# Patient Record
Sex: Male | Born: 1990 | Race: White | Hispanic: No | Marital: Single | Smoking: Current some day smoker
Health system: Southern US, Community
[De-identification: ages and names within clinical notes are randomized; demographics above are authoritative.]

---
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
## 2008-01-18 ENCOUNTER — Emergency Department (HOSPITAL_COMMUNITY): Admission: EM | Admit: 2008-01-18 | Discharge: 2008-01-18 | Payer: Self-pay | Admitting: Emergency Medicine

## 2008-02-25 ENCOUNTER — Emergency Department: Payer: Self-pay | Admitting: Emergency Medicine

## 2008-08-26 ENCOUNTER — Emergency Department: Payer: Self-pay | Admitting: Unknown Physician Specialty

## 2008-11-23 ENCOUNTER — Emergency Department: Payer: Self-pay | Admitting: Emergency Medicine

## 2008-11-25 ENCOUNTER — Emergency Department: Payer: Self-pay | Admitting: Emergency Medicine

## 2009-05-08 ENCOUNTER — Emergency Department: Payer: Self-pay | Admitting: Emergency Medicine

## 2009-06-25 ENCOUNTER — Emergency Department: Payer: Self-pay | Admitting: Emergency Medicine

## 2009-12-14 ENCOUNTER — Emergency Department: Payer: Self-pay | Admitting: Internal Medicine

## 2011-08-10 LAB — URINALYSIS, ROUTINE W REFLEX MICROSCOPIC
Glucose, UA: NEGATIVE
Protein, ur: NEGATIVE
pH: 5.5

## 2011-11-15 ENCOUNTER — Emergency Department: Payer: Self-pay | Admitting: Emergency Medicine

## 2018-05-20 ENCOUNTER — Encounter (HOSPITAL_COMMUNITY): Payer: Self-pay | Admitting: Emergency Medicine

## 2018-05-20 ENCOUNTER — Other Ambulatory Visit: Payer: Self-pay

## 2018-05-20 ENCOUNTER — Emergency Department (HOSPITAL_COMMUNITY)
Admission: EM | Admit: 2018-05-20 | Discharge: 2018-05-20 | Disposition: A | Discharge status: Home / Self Care | Payer: Self-pay | Attending: Emergency Medicine | Admitting: Emergency Medicine

## 2018-05-20 DIAGNOSIS — R109 Unspecified abdominal pain: Secondary | ICD-10-CM | POA: Insufficient documentation

## 2018-05-20 DIAGNOSIS — Z5321 Procedure and treatment not carried out due to patient leaving prior to being seen by health care provider: Secondary | ICD-10-CM | POA: Insufficient documentation

## 2018-05-20 NOTE — ED Notes (Signed)
Pt called for a room 3 times with no responce

## 2018-05-20 NOTE — ED Triage Notes (Signed)
Pt complaint of lest testicle pain post lifting a tire 5 days ago; denies swelling.

## 2018-05-20 NOTE — ED Notes (Signed)
Called to room x 2

## 2018-05-23 NOTE — ED Notes (Signed)
Follow up call made  No answer  05/23/18  0928  s Aleni Andrus rn

## 2018-05-28 ENCOUNTER — Emergency Department: Payer: Self-pay

## 2018-05-28 ENCOUNTER — Emergency Department
Admission: EM | Admit: 2018-05-28 | Discharge: 2018-05-28 | Disposition: A | Discharge status: Home / Self Care | Payer: Self-pay | Attending: Emergency Medicine | Admitting: Emergency Medicine

## 2018-05-28 ENCOUNTER — Other Ambulatory Visit: Payer: Self-pay

## 2018-05-28 DIAGNOSIS — N50819 Testicular pain, unspecified: Secondary | ICD-10-CM

## 2018-05-28 DIAGNOSIS — I861 Scrotal varices: Secondary | ICD-10-CM | POA: Insufficient documentation

## 2018-05-28 MED ORDER — NAPROXEN 500 MG PO TABS: 500.0000 mg | ORAL_TABLET | Freq: Two times a day (BID) | ORAL | 0 refills | Status: DC

## 2018-05-28 NOTE — ED Triage Notes (Signed)
Pt states L testicular pain x 1 week. Denies injury. Denies swelling. Alert, oriented, ambulatory.

## 2018-05-28 NOTE — ED Provider Notes (Signed)
[facility] Emergency Department Provider Note  ____________________________________________   First MD Initiated Contact with Patient 05/28/18 [phone]     (approximate)  I have reviewed the triage vital signs and the nursing notes.   HISTORY  Chief Complaint Testicle Pain  HPI Douglas [name] is a 27 y.o. male is here with complaint of left testicular pain.  Patient states pain started approximately 1 week ago when he was lifting a heavy tire.  He denies any difficulties prior to this.  He denies any abdominal pain.  He states that the pain is in his scrotum and not in the groin area.  He denies any fever or chills.  There is been no urinary symptoms or penile discharge.  Rates pain as 4/10.  History reviewed. No pertinent past medical history.  There are no active problems to display for this patient.   History reviewed. No pertinent surgical history.  Prior to Admission medications   Medication Sig Start Date End Date Taking? Authorizing Provider  naproxen (NAPROSYN) 500 MG tablet Take 1 tablet (500 mg total) by mouth 2 (two) times daily with a meal. 05/28/18   Tommi Rumps, PA-C    Allergies Patient has no known allergies.  History reviewed. No pertinent family history.  Social History Social History   Tobacco Use  . Smoking status: Never Smoker  Substance Use Topics  . Alcohol use: Yes  . Drug use: Not on file    Review of Systems Constitutional: No fever/chills Cardiovascular: Denies chest pain. Respiratory: Denies shortness of breath. Genitourinary: Positive for left scrotal pain. Musculoskeletal: Negative for back pain. Skin: Negative for rash. Neurological: Negative for headaches, focal weakness or numbness. ___________________________________________   PHYSICAL EXAM:  VITAL SIGNS: ED Triage Vitals  Enc Vitals Group     BP 05/28/18 0821 120/88     Pulse Rate 05/28/18 0821 70     Resp 05/28/18 0821 15     Temp 05/28/18  0821 98.6 F (37 C)     Temp Source 05/28/18 0821 Oral     SpO2 05/28/18 0821 97 %     Weight 05/28/18 0824 170 lb (77.1 kg)     Height 05/28/18 0824 5\' 9"  (1.753 m)     Head Circumference --      Peak Flow --      Pain Score 05/28/18 0824 4     Pain Loc --      Pain Edu? --      Excl. in GC? --    Constitutional: Alert and oriented. Well appearing and in no acute distress. Eyes: Conjunctivae are normal.  Head: Atraumatic. Neck: No stridor.   Cardiovascular: Normal rate, regular rhythm. Grossly normal heart sounds.  Good peripheral circulation. Respiratory: Normal respiratory effort.  No retractions. Lungs CTAB. Gastrointestinal: Soft and nontender. No distention. Genitourinary: On examination there is no gross deformity noted.  There is no inguinal tenderness.  There is tenderness on palpation of the left scrotum distally.  No erythema, abrasions or soft tissue edema present. Musculoskeletal: Moves upper and lower extremities without any difficulty normal gait was noted. Neurologic:  Normal speech and language. No gross focal neurologic deficits are appreciated.  Skin:  Skin is warm, dry and intact.  Psychiatric: Mood and affect are normal. Speech and behavior are normal.  ____________________________________________   LABS (all labs ordered are listed, but only abnormal results are displayed)  Labs Reviewed - No data to display  RADIOLOGY   Official radiology report(s): US  Scrotum  Result Date: 05/28/2018 CLINICAL DATA:  27 year old male with left-sided testicular pain for a week EXAM: SCROTAL ULTRASOUND DOPPLER ULTRASOUND OF THE TESTICLES TECHNIQUE: Complete ultrasound examination of the testicles, epididymis, and other scrotal structures was performed. Color and spectral Doppler ultrasound were also utilized to evaluate blood flow to the testicles. COMPARISON:  None. FINDINGS: Right testicle Measurements: 3.2 x 1.9 x 2.8 cm. No mass or microlithiasis visualized. Left  testicle Measurements: 3.9 x 2.3 x 2.6 cm. No mass or microlithiasis visualized. Right epididymis:  Normal in size and appearance. Left epididymis:  Normal in size and appearance. Hydrocele:  None visualized. Varicocele:  Positive for a small left-sided varicocele. Pulsed Doppler interrogation of both testes demonstrates normal low resistance arterial and venous waveforms bilaterally. IMPRESSION: 1. No evidence of testicular torsion at this time. 2. No evidence of orchitis, epididymitis or other acute abnormality. 3. Positive for a small left-sided varicocele which could represent a source of left testicular discomfort. Electronically Signed   By: Malachy MoanHeath  McCullough M.D.   On: 05/28/2018 09:35   [place] Scrotum Doppler  Result Date: 05/28/2018 CLINICAL DATA:  27 year old male with left-sided testicular pain for a week EXAM: SCROTAL ULTRASOUND DOPPLER ULTRASOUND OF THE TESTICLES TECHNIQUE: Complete ultrasound examination of the testicles, epididymis, and other scrotal structures was performed. Color and spectral Doppler ultrasound were also utilized to evaluate blood flow to the testicles. COMPARISON:  None. FINDINGS: Right testicle Measurements: 3.2 x 1.9 x 2.8 cm. No mass or microlithiasis visualized. Left testicle Measurements: 3.9 x 2.3 x 2.6 cm. No mass or microlithiasis visualized. Right epididymis:  Normal in size and appearance. Left epididymis:  Normal in size and appearance. Hydrocele:  None visualized. Varicocele:  Positive for a small left-sided varicocele. Pulsed Doppler interrogation of both testes demonstrates normal low resistance arterial and venous waveforms bilaterally. IMPRESSION: 1. No evidence of testicular torsion at this time. 2. No evidence of orchitis, epididymitis or other acute abnormality. 3. Positive for a small left-sided varicocele which could represent a source of left testicular discomfort. Electronically Signed   By: Malachy MoanHeath  McCullough M.D.   On: 05/28/2018 09:35     ____________________________________________   PROCEDURES  Procedure(s) performed: None  Procedures  Critical Care performed: No  ____________________________________________   INITIAL IMPRESSION / ASSESSMENT AND PLAN / ED COURSE  As part of my medical decision making, I reviewed the following data within the electronic MEDICAL RECORD NUMBER Notes from prior ED visits and [place] Controlled Substance Database  Patient was reassured that he did not have a testicular torsion.  We discussed his ultrasound findings and he was given information about varicoceles.  Patient will take naproxen 500 mg twice daily for the next 10 days.  If there is no improvement he is to follow-up with the urologist listed on his discharge papers who is on-call this weekend.  If any severe worsening of his symptoms he is to return to the emergency department this weekend.  ____________________________________________   FINAL CLINICAL IMPRESSION(S) / ED DIAGNOSES  Final diagnoses:  Left varicocele     ED Discharge Orders        Ordered    naproxen (NAPROSYN) 500 MG tablet  2 times daily with meals     05/28/18 0955       Note:  This document was prepared using Dragon voice recognition software and may include unintentional dictation errors.    Tommi RumpsSummers, Noemy Hallmon L, PA-C 05/28/18 1524    Minna AntisPaduchowski, Kevin, MD 05/29/18 1245

## 2018-05-28 NOTE — Discharge Instructions (Addendum)
Begin taking naproxen 500 mg twice daily with food.  If not improving you will need to see a urologist.  You may call 1 of the offices listed in [place] however the urologist on call today has an office in [place].  His contact information is listed on your discharge papers.

## 2018-05-28 NOTE — ED Notes (Signed)
See triage note   States he developed pain to left testicle after lifting heavy tire at work  Denies any n/v/d ,urinary sx's or discharge  Also denies any swelling to groin or testicle

## 2020-06-15 ENCOUNTER — Other Ambulatory Visit: Payer: Self-pay

## 2020-06-15 ENCOUNTER — Emergency Department
Admission: EM | Admit: 2020-06-15 | Discharge: 2020-06-17 | Disposition: A | Discharge status: Home / Self Care | Payer: Self-pay | Attending: Emergency Medicine | Admitting: Emergency Medicine

## 2020-06-15 ENCOUNTER — Emergency Department: Payer: Self-pay

## 2020-06-15 DIAGNOSIS — R42 Dizziness and giddiness: Secondary | ICD-10-CM | POA: Insufficient documentation

## 2020-06-15 DIAGNOSIS — Z5321 Procedure and treatment not carried out due to patient leaving prior to being seen by health care provider: Secondary | ICD-10-CM | POA: Insufficient documentation

## 2020-06-15 DIAGNOSIS — R0789 Other chest pain: Secondary | ICD-10-CM | POA: Insufficient documentation

## 2020-06-15 LAB — BASIC METABOLIC PANEL
Anion gap: 11 (ref 5–15)
BUN: 12 mg/dL (ref 6–20)
CO2: 26 mmol/L (ref 22–32)
Calcium: 9.3 mg/dL (ref 8.9–10.3)
Chloride: 100 mmol/L (ref 98–111)
Creatinine, Ser: 0.88 mg/dL (ref 0.61–1.24)
GFR calc Af Amer: >60 mL/min (ref >60)
GFR calc non Af Amer: >60 mL/min (ref >60)
Glucose, Bld: 130 mg/dL — ABNORMAL HIGH (ref 70–99)
Potassium: 3.6 mmol/L (ref 3.5–5.1)
Sodium: 137 mmol/L (ref 135–145)

## 2020-06-15 LAB — CBC
HCT: 45.5 % (ref 39.0–52.0)
Hemoglobin: 16.6 g/dL (ref 13.0–17.0)
MCH: 32.9 pg (ref 26.0–34.0)
MCHC: 36.5 g/dL — ABNORMAL HIGH (ref 30.0–36.0)
MCV: 90.3 fL (ref 80.0–100.0)
Platelets: 236 10*3/uL (ref 150–400)
RBC: 5.04 MIL/uL (ref 4.22–5.81)
RDW: 12.1 % (ref 11.5–15.5)
WBC: 5 10*3/uL (ref 4.0–10.5)
nRBC: 0 % (ref 0.0–0.2)

## 2020-06-15 LAB — TROPONIN I (HIGH SENSITIVITY)
Troponin I (High Sensitivity): 3 ng/L (ref <18)
Troponin I (High Sensitivity): 3 ng/L (ref <18)

## 2020-06-15 MED ORDER — SODIUM CHLORIDE 0.9% FLUSH: 3.0000 mL | Freq: Once | INTRAVENOUS | Status: DC

## 2020-06-15 NOTE — ED Triage Notes (Signed)
Pt arrives POV for c/o left side of chest "feeling funny" x several weeks. PT reports he does have some pain in the area but states it just "doesn't feel right". Pt reports feeling like he is going to "give out" and reports dizziness and lightheadedness as well. Pt ambulatory from triage in NAD, skin warm and dry, no shob.

## 2020-09-05 ENCOUNTER — Encounter: Payer: Self-pay | Admitting: Emergency Medicine

## 2020-09-05 ENCOUNTER — Other Ambulatory Visit: Payer: Self-pay

## 2020-09-05 ENCOUNTER — Emergency Department
Admission: EM | Admit: 2020-09-05 | Discharge: 2020-09-05 | Disposition: A | Discharge status: Home / Self Care | Payer: Self-pay | Attending: Emergency Medicine | Admitting: Emergency Medicine

## 2020-09-05 ENCOUNTER — Emergency Department: Payer: Self-pay

## 2020-09-05 DIAGNOSIS — R072 Precordial pain: Secondary | ICD-10-CM | POA: Insufficient documentation

## 2020-09-05 DIAGNOSIS — R079 Chest pain, unspecified: Secondary | ICD-10-CM

## 2020-09-05 DIAGNOSIS — F17200 Nicotine dependence, unspecified, uncomplicated: Secondary | ICD-10-CM | POA: Insufficient documentation

## 2020-09-05 LAB — TROPONIN I (HIGH SENSITIVITY)
Troponin I (High Sensitivity): 5 ng/L (ref <18)
Troponin I (High Sensitivity): 4 ng/L (ref <18)

## 2020-09-05 LAB — CBC
HCT: 40.6 % (ref 39.0–52.0)
Hemoglobin: 14.8 g/dL (ref 13.0–17.0)
MCH: 33 pg (ref 26.0–34.0)
MCHC: 36.5 g/dL — ABNORMAL HIGH (ref 30.0–36.0)
MCV: 90.6 fL (ref 80.0–100.0)
Platelets: 232 10*3/uL (ref 150–400)
RBC: 4.48 MIL/uL (ref 4.22–5.81)
RDW: 11.9 % (ref 11.5–15.5)
WBC: 8.8 10*3/uL (ref 4.0–10.5)
nRBC: 0 % (ref 0.0–0.2)

## 2020-09-05 LAB — BASIC METABOLIC PANEL
Anion gap: 13 (ref 5–15)
BUN: 8 mg/dL (ref 6–20)
CO2: 23 mmol/L (ref 22–32)
Calcium: 8.7 mg/dL — ABNORMAL LOW (ref 8.9–10.3)
Chloride: 99 mmol/L (ref 98–111)
Creatinine, Ser: 0.65 mg/dL (ref 0.61–1.24)
GFR, Estimated: >60 mL/min (ref >60)
Glucose, Bld: 109 mg/dL — ABNORMAL HIGH (ref 70–99)
Potassium: 3.4 mmol/L — ABNORMAL LOW (ref 3.5–5.1)
Sodium: 135 mmol/L (ref 135–145)

## 2020-09-05 MED ORDER — LIDOCAINE VISCOUS HCL 2 % MT SOLN
15.0000 mL | Freq: Once | OROMUCOSAL | Status: AC
  Administered 2020-09-05: 15 mL via ORAL
  Filled 2020-09-05: qty 15

## 2020-09-05 MED ORDER — SUCRALFATE 1 GM/10ML PO SUSP: 1.0000 g | Freq: Four times a day (QID) | ORAL | 1 refills | Status: AC

## 2020-09-05 MED ORDER — SUCRALFATE 1 GM/10ML PO SUSP: 1.0000 g | Freq: Four times a day (QID) | ORAL | 1 refills | Status: DC

## 2020-09-05 MED ORDER — ALUM & MAG HYDROXIDE-SIMETH 200-200-20 MG/5ML PO SUSP
30.0000 mL | Freq: Once | ORAL | Status: AC
  Administered 2020-09-05: 30 mL via ORAL
  Filled 2020-09-05: qty 30

## 2020-09-05 MED ORDER — FAMOTIDINE 20 MG PO TABS: 20.0000 mg | ORAL_TABLET | Freq: Two times a day (BID) | ORAL | 1 refills | Status: AC

## 2020-09-05 MED ORDER — FAMOTIDINE 20 MG PO TABS: 20.0000 mg | ORAL_TABLET | Freq: Two times a day (BID) | ORAL | 1 refills | Status: DC

## 2020-09-05 NOTE — ED Triage Notes (Signed)
Pt comes into the ED via POV c/o central chest pain that increases when taking a deep breath.  Pt has no diaphoresis, no cardiac history, no SHOB, N/V.  Pt in NAD with even and unlabored respirations.

## 2020-09-05 NOTE — ED Provider Notes (Signed)
[name] Provider Note   ____________________________________________   First MD Initiated Contact with Patient 09/05/20 1717     (approximate)  I have reviewed the triage vital signs and the nursing notes.   HISTORY  Chief Complaint Chest Pain    HPI Douglas [name] is a 29 y.o. male with no stated past medical history the presents for substernal chest aching that began on awakening this morning.  Patient states that this pain has been steady since onset and is worsened with deep breathing.  Patient describes this pain as 7/10 and nonradiating.  Patient does endorse worsening with exertion.  Denies any history of cardiac disease or family history of early cardiac disease.  Patient denies any relieving factors.  Patient has tried naproxen for this pain with minimal relief      No past medical history on file.  There are no problems to display for this patient.   No past surgical history on file.  Prior to Admission medications   Medication Sig Start Date End Date Taking? Authorizing Provider  famotidine (PEPCID) 20 MG tablet Take 1 tablet (20 mg total) by mouth 2 (two) times daily. 09/05/20 09/05/21  Merwyn Katos, MD  naproxen (NAPROSYN) 500 MG tablet Take 1 tablet (500 mg total) by mouth 2 (two) times daily with a meal. 05/28/18   Bridget Hartshorn L, PA-C  sucralfate (CARAFATE) 1 GM/10ML suspension Take 10 mLs (1 g total) by mouth 4 (four) times daily. 09/05/20 09/05/21  Merwyn Katos, MD    Allergies Patient has no known allergies.  No family history on file.  Social History Social History   Tobacco Use  . Smoking status: Current Some Day Smoker  . Smokeless tobacco: Never Used  Substance Use Topics  . Alcohol use: Yes  . Drug use: Not Currently    Review of Systems Constitutional: No fever/chills Eyes: No visual changes. ENT: No sore throat. Cardiovascular: Endorses chest pain. Respiratory: Endorses  shortness of breath. Gastrointestinal: No abdominal pain.  No nausea, no vomiting.  No diarrhea. Genitourinary: Negative for dysuria. Musculoskeletal: Negative for acute arthralgias Skin: Negative for rash. Neurological: Negative for headaches, weakness/numbness/paresthesias in any extremity Psychiatric: Negative for suicidal ideation/homicidal ideation   ____________________________________________   PHYSICAL EXAM:  VITAL SIGNS: ED Triage Vitals  Enc Vitals Group     BP 09/05/20 1608 120/67     Pulse Rate 09/05/20 1608 95     Resp 09/05/20 1608 20     Temp 09/05/20 1608 100.1 F (37.8 C)     Temp Source 09/05/20 1608 Oral     SpO2 09/05/20 1608 99 %     Weight 09/05/20 1606 170 lb (77.1 kg)     Height 09/05/20 1606 5\' 11"  (1.803 m)     Head Circumference --      Peak Flow --      Pain Score 09/05/20 1606 9     Pain Loc --      Pain Edu? --      Excl. in GC? --    Constitutional: Alert and oriented. Well appearing and in no acute distress. Eyes: Conjunctivae are normal. PERRL. Head: Atraumatic. Nose: No congestion/rhinnorhea. Mouth/Throat: Mucous membranes are moist. Neck: No stridor Cardiovascular: Grossly normal heart sounds.  Good peripheral circulation. Respiratory: Normal respiratory effort.  No retractions. Gastrointestinal: Soft and nontender. No distention. Musculoskeletal: No obvious deformities Neurologic:  Normal speech and language. No gross focal neurologic deficits are appreciated. Skin:  Skin  is warm and dry. No rash noted. Psychiatric: Mood and affect are normal. Speech and behavior are normal.  ____________________________________________   LABS (all labs ordered are listed, but only abnormal results are displayed)  Labs Reviewed  BASIC METABOLIC PANEL - Abnormal; Notable for the following components:      Result Value   Potassium 3.4 (*)    Glucose, Bld 109 (*)    Calcium 8.7 (*)    All other components within normal limits  CBC - Abnormal;  Notable for the following components:   MCHC 36.5 (*)    All other components within normal limits  TROPONIN I (HIGH SENSITIVITY)  TROPONIN I (HIGH SENSITIVITY)   ____________________________________________  EKG  ED ECG REPORT I, Merwyn Katos, the attending physician, personally viewed and interpreted this ECG.  Date: 09/05/2020 EKG Time: 1809 Rate: 86 Rhythm: normal sinus rhythm QRS Axis: normal Intervals: incomplete RBBB ST/T Wave abnormalities: normal Narrative Interpretation: no evidence of acute ischemia  ____________________________________________  RADIOLOGY  ED MD interpretation: 2 view x-ray of the chest shows no evidence of acute abnormalities including no pneumothorax, pneumonia, or widened mediastinum  Official radiology report(s): DG Chest 2 View  Result Date: 09/05/2020 CLINICAL DATA:  Chest pain which increases when taking a deep breath EXAM: CHEST - 2 VIEW COMPARISON:  06/15/2020 FINDINGS: Normal heart size, mediastinal contours, and pulmonary vascularity. Lungs clear. No pleural effusion or pneumothorax. Bones unremarkable. IMPRESSION: Normal exam. Electronically Signed   By: Ulyses Southward M.D.   On: 09/05/2020 16:38    ____________________________________________   PROCEDURES  Procedure(s) performed (including Critical Care):  .1-3 Lead EKG Interpretation Performed by: Merwyn Katos, MD Authorized by: Merwyn Katos, MD     Interpretation: normal     ECG rate:  77   ECG rate assessment: normal     Rhythm: sinus rhythm     Ectopy: none     Conduction: normal   Comments:     Incomplete RBBB     ____________________________________________   INITIAL IMPRESSION / ASSESSMENT AND PLAN / ED COURSE  As part of my medical decision making, I reviewed the following data within the electronic MEDICAL RECORD NUMBER Nursing notes reviewed and incorporated, Labs reviewed, EKG interpreted, Old chart reviewed, Radiograph reviewed and Notes from prior ED  visits reviewed and incorporated        Workup: ECG, CXR, CBC, BMP, Troponin Findings: ECG: No overt evidence of STEMI. No evidence of Brugadas sign, delta wave, epsilon wave, significantly prolonged QTc, or malignant arrhythmia HS Troponin: Negative x1 Other Labs unremarkable for emergent problems. CXR: Without PTX, PNA, or widened mediastinum Last Stress Test:  never Last Heart Catheterization:  never HEART Score: 2  Given History, Exam, and Workup I have low suspicion for ACS, Pneumothorax, Pneumonia, Pulmonary Embolus, Tamponade, Aortic Dissection or other emergent problem as a cause for this presentation.   Reassesment: Prior to discharge patients pain was controlled and they were well appearing.  Disposition:  Discharge. Strict return precautions discussed with patient with full understanding. Advised patient to follow up promptly with primary care provider      ____________________________________________   FINAL CLINICAL IMPRESSION(S) / ED DIAGNOSES  Final diagnoses:  Nonspecific chest pain     ED Discharge Orders         Ordered    famotidine (PEPCID) 20 MG tablet  2 times daily,   Status:  Discontinued        09/05/20 1911    sucralfate (CARAFATE) 1 GM/10ML  suspension  4 times daily,   Status:  Discontinued        09/05/20 1911    famotidine (PEPCID) 20 MG tablet  2 times daily        09/05/20 1913    sucralfate (CARAFATE) 1 GM/10ML suspension  4 times daily        09/05/20 1913           Note:  This document was prepared using Dragon voice recognition software and may include unintentional dictation errors.   Merwyn Katos, MD 09/05/20 [phone]

## 2020-11-08 ENCOUNTER — Observation Stay
Admission: EM | Admit: 2020-11-08 | Discharge: 2020-11-09 | Disposition: A | Discharge status: Home / Self Care | Payer: Self-pay | Attending: Vascular Surgery | Admitting: Vascular Surgery

## 2020-11-08 ENCOUNTER — Other Ambulatory Visit: Payer: Self-pay

## 2020-11-08 DIAGNOSIS — Z20822 Contact with and (suspected) exposure to covid-19: Secondary | ICD-10-CM | POA: Insufficient documentation

## 2020-11-08 DIAGNOSIS — S41112A Laceration without foreign body of left upper arm, initial encounter: Secondary | ICD-10-CM | POA: Diagnosis present

## 2020-11-08 DIAGNOSIS — F17200 Nicotine dependence, unspecified, uncomplicated: Secondary | ICD-10-CM | POA: Insufficient documentation

## 2020-11-08 DIAGNOSIS — I9589 Other hypotension: Secondary | ICD-10-CM

## 2020-11-08 DIAGNOSIS — S55212A Laceration of vein at forearm level, left arm, initial encounter: Principal | ICD-10-CM | POA: Insufficient documentation

## 2020-11-08 DIAGNOSIS — W134XXA Fall from, out of or through window, initial encounter: Secondary | ICD-10-CM | POA: Insufficient documentation

## 2020-11-08 DIAGNOSIS — Z23 Encounter for immunization: Secondary | ICD-10-CM | POA: Insufficient documentation

## 2020-11-08 LAB — CBC WITH DIFFERENTIAL/PLATELET
Abs Immature Granulocytes: 0.02 10*3/uL (ref 0.00–0.07)
Basophils Absolute: 0 10*3/uL (ref 0.0–0.1)
Basophils Relative: 0 %
Eosinophils Absolute: 0.2 10*3/uL (ref 0.0–0.5)
Eosinophils Relative: 3 %
HCT: 44.7 % (ref 39.0–52.0)
Hemoglobin: 15.7 g/dL (ref 13.0–17.0)
Immature Granulocytes: 0 %
Lymphocytes Relative: 46 %
Lymphs Abs: 2.9 10*3/uL (ref 0.7–4.0)
MCH: 33.2 pg (ref 26.0–34.0)
MCHC: 35.1 g/dL (ref 30.0–36.0)
MCV: 94.5 fL (ref 80.0–100.0)
Monocytes Absolute: 0.7 10*3/uL (ref 0.1–1.0)
Monocytes Relative: 11 %
Neutro Abs: 2.5 10*3/uL (ref 1.7–7.7)
Neutrophils Relative %: 40 %
Platelets: 302 10*3/uL (ref 150–400)
RBC: 4.73 MIL/uL (ref 4.22–5.81)
RDW: 11.8 % (ref 11.5–15.5)
WBC: 6.2 10*3/uL (ref 4.0–10.5)
nRBC: 0 % (ref 0.0–0.2)

## 2020-11-08 MED ORDER — SODIUM CHLORIDE 0.9 % IV SOLN: 10.0000 mL/h | Freq: Once | INTRAVENOUS | Status: AC

## 2020-11-08 MED ORDER — LIDOCAINE HCL (PF) 1 % IJ SOLN
INTRAMUSCULAR | Status: AC
  Filled 2020-11-08: qty 5

## 2020-11-08 NOTE — ED Provider Notes (Signed)
**Douglas [name] De-Identified via Obfuscation** Douglas Douglas [name]  ____________________________________________   Event Date/Time   First Douglas Douglas [name] Initiated Contact with Patient 11/08/20 2349     (approximate)  I have reviewed the triage vital signs and the nursing notes.   HISTORY  Chief Complaint Laceration   HPI Douglas Douglas [name] is a 29 y.o. male without significant past medical history who presents to the emergency room for assessment of a cut he sustained to the front of his left elbow that occurred mainly prior to arrival.  Patient states he was putting up Christmas lights when he fell through a window.  He denies any other injuries.  Denies hitting his head or any LOC.  States he is otherwise been in his usual state of health without any recent fevers, chills, cough, nausea, vomiting, diarrhea, dysuria, rash, abdominal pain, back pain, other acute extremity pain or recent injuries.  Denies any EtOH use illicit drug use but does endorse some tobacco abuse.  Dates he is not sure when his last tetanus shot was.          History reviewed. No pertinent past medical history.  There are no problems to display for this patient.   No past surgical history on file.  Prior to Admission medications   Medication Sig Start Date End Date Taking? Authorizing Provider  famotidine (PEPCID) 20 MG tablet Take 1 tablet (20 mg total) by mouth 2 (two) times daily. 09/05/20 09/05/21 Yes Bradler, Clent Jacks, Douglas Douglas [name]  sucralfate (CARAFATE) 1 GM/10ML suspension Take 10 mLs (1 g total) by mouth 4 (four) times daily. 09/05/20 09/05/21 Yes Merwyn Katos, Douglas Douglas [name]    Allergies Patient has no known allergies.  No family history on file.  Social History Social History   Tobacco Use   Smoking status: Current Some Day Smoker   Smokeless tobacco: Never Used  Substance Use Topics   Alcohol use: Yes   Drug use: Not Currently    Review of Systems  Review of Systems  Constitutional: Negative for chills  and fever.  HENT: Negative for sore throat.   Eyes: Negative for pain.  Respiratory: Negative for cough and stridor.   Cardiovascular: Negative for chest pain.  Gastrointestinal: Negative for vomiting.  Genitourinary: Negative for dysuria.  Musculoskeletal: Positive for myalgias ( L to left forearm ).  Skin: Negative for rash.  Neurological: Negative for seizures, loss of consciousness and headaches.  Psychiatric/Behavioral: Negative for suicidal ideas.  All other systems reviewed and are negative.     ____________________________________________   PHYSICAL EXAM:  VITAL SIGNS: ED Triage Vitals  Enc Vitals Group     BP 11/08/20 2325 (!) 110/52     Pulse Rate 11/08/20 2324 (!) 116     Resp 11/08/20 2324 16     Temp --      Temp src --      SpO2 11/08/20 2324 96 %     Weight 11/08/20 2323 170 lb (77.1 kg)     Height 11/08/20 2323 5\' 10"  (1.778 m)     Head Circumference --      Peak Flow --      Pain Score --      Pain Loc --      Pain Edu? --      Excl. in GC? --    Vitals:   11/09/20 0215 11/09/20 0230  BP: 108/73 101/65  Pulse: 78   Resp: 15 13  SpO2: 100%    Physical Exam Vitals and nursing  Douglas [name] reviewed.  Constitutional:      General: He is in acute distress.     Appearance: He is well-developed and well-nourished. He is ill-appearing and diaphoretic.  HENT:     Head: Normocephalic and atraumatic.     Right Ear: External ear normal.     Left Ear: External ear normal.     Nose: Nose normal.     Mouth/Throat:     Mouth: Mucous membranes are dry.  Eyes:     Conjunctiva/sclera: Conjunctivae normal.  Cardiovascular:     Rate and Rhythm: Normal rate and regular rhythm.     Heart sounds: No murmur heard.   Pulmonary:     Effort: Pulmonary effort is normal. No respiratory distress.     Breath sounds: Normal breath sounds.  Abdominal:     Palpations: Abdomen is soft.     Tenderness: There is no abdominal tenderness.  Musculoskeletal:        General: No  edema.     Cervical back: Neck supple.  Skin:    General: Skin is warm.     Capillary Refill: Capillary refill takes 2 to 3 seconds.  Neurological:     Mental Status: He is alert and oriented to person, place, and time.  Psychiatric:        Mood and Affect: Mood and affect and mood normal.     Approximately 4 cm jagged laceration over the left brachial area with some active arterial bleeding without obvious source.  Eating seems to be emanating from area of medial to the laceration from fairly deep source.  2+ radial pulse.  Sensation is intact in the distribution of the ulnar radial median nerves in the left hand.  Patient is able to grip this examiner's hand with symmetric strength compared to right hand. ____________________________________________   LABS (all labs ordered are listed, but only abnormal results are displayed)  Labs Reviewed  COMPREHENSIVE METABOLIC PANEL - Abnormal; Notable for the following components:      Result Value   Glucose, Bld 131 (*)    AST 42 (*)    All other components within normal limits  CBC WITH DIFFERENTIAL/PLATELET - Abnormal; Notable for the following components:   Neutro Abs 8.2 (*)    All other components within normal limits  RESP PANEL BY RT-PCR (FLU A&B, COVID) ARPGX2  CBC WITH DIFFERENTIAL/PLATELET  PROTIME-INR  FIBRINOGEN  TYPE AND SCREEN  PREPARE RBC (CROSSMATCH)  TYPE AND SCREEN  TYPE AND SCREEN   ____________________________________________  ____________________________________________  RADIOLOGY  ED Douglas Douglas [name] interpretation: Plain film and CT of the patient's left arm show evidence of hematoma with some retained foreign bodies.  Official radiology report(s): DG Elbow 2 Views Left  Result Date: 11/09/2020 CLINICAL DATA:  Evaluate for retained glass EXAM: LEFT ELBOW - 2 VIEW COMPARISON:  CT angiography 11/09/2020 FINDINGS: Corresponding well to the appearance on CT angiography is focal soft tissue swelling along the medial  aspect of the left elbow with several geometric radiodensities concerning for retained foreign bodies. There are 4 slightly larger fragments and a more tiny punctate focus towards the medial epicondyle few soft tissue gas. Few punctate air lucencies are present towards the antecubital fossa. No acute bony abnormality. Specifically, no fracture, subluxation, or dislocation. IMPRESSION: 1. Focal soft tissue swelling along the medial aspect of the left elbow with several geometric radiodensities concerning for retained foreign bodies. Four larger fragments towards the volar and medial soft tissues of the antecubital fossa and elbow with amorphous punctate  focus near the medial epicondyle. Few foci of soft tissue gas as well. Electronically Signed   By: Kreg Shropshire M.D.   On: 11/09/2020 01:36   CT ANGIO UP EXTREM LEFT W &/OR WO CONTAST  Result Date: 11/09/2020 CLINICAL DATA:  Laceration to the left forearm, elbow with heavy bleeding despite pressure. CMS intact to the fingers EXAM: CT ANGIOGRAPHY OF THE UPPER LEFT EXTREMITY WITH CONTRAST TECHNIQUE: Multidetector CT angiographic imaging of the upper left extremity was performed according to the standard protocol following intravenous contrast administration. CONTRAST:  OMNIPAQUE IOHEXOL 350 MG/ML SOLN COMPARISON:  None. FINDINGS: Vasculature: Aortic arch: No evidence of aneurysm, dissection or vasculitis. Shared origin of the brachiocephalic and left common carotid arteries. Subclavian artery: Widely patent. No acute luminal abnormality. No aneurysm or ectasia. Normal appearance of the proximal branches of the vertebral, suprascapular, superior thoracic arteries. Axillary artery: Widely patent. No acute luminal abnormality. No aneurysm or ectasia. Normal appearance of the lateral thoracic and subscapular branches. Brachial artery: Appears widely patent. Courses in close proximity to soft tissue laceration, gas and radiodense foreign bodies in the anteromedial  aspect of the antecubital fossa and proximal forearm. No evidence of direct vascular injury or acute luminal abnormality. No aneurysm or ectasia. No sites of active extravasation from the vessel or visible minor branches. Radial artery: Appears patent. Gradual tapering by the level of the distal forearm is favored to be related to contrast timing/technique. No direct vascular injury. No acute luminal abnormality. No aneurysm or ectasia. Ulnar artery: Appears patent. Gradual tapering by the level of the wrist is favored to be related to contrast timing/technique. No direct vascular injury. No acute luminal abnormality. No aneurysm or ectasia. Interosseous artery: Visible proximal course is unremarkable. Bones/Joint/Cartilage No acute bony abnormality. Specifically, no fracture, subluxation, or dislocation of the osseous structures of the left upper extremity or included portions of the left chest, abdomen and pelvis. The included thoracolumbar spine is free of acute abnormality. Ligaments Suboptimally assessed by CT. Muscles and Tendons Thickening, stranding and soft tissue gas is present predominantly within the musculature of the proximal forearm including the pronator teres, flexor carpi radialis and flexor digitorum superficialis. Few intramuscular geometric radiodensities are present in the proximal anteromedial forearm, largest measuring up to 13 mm in size within the pronator teres (5/118). No intramuscular blush of contrast extravasation is seen. Soft tissues Soft tissue thickening, gas and laceration seen along the medial aspect of the elbow and anteromedial proximal forearm with multiple radiodense foreign bodies, largest in the pronator teres muscle belly as above. Suspect some degree of hematoma though difficult to fully assess given beam hardening and streak artifact given inability to elevate the arm for examination. Included portions of the chest, abdomen and pelvis are free of acute or worrisome  abnormality. IMPRESSION: 1. Suboptimal contrast timing and technique with patient's arm scanned at the side resulting in extensive streak artifact and beam hardening which may limit detection of subtle abnormalities. 2. No evidence of direct arterial injury or acute luminal abnormality of the left upper extremity. Gradual tapering of the radial and ulnar arteries is favored to be related to contrast timing/technique. 3. Soft tissue laceration along the medial aspect of the elbow and anteromedial proximal forearm with multiple radiodense foreign bodies, largest in the pronator teres muscle belly. Ill-defined soft tissue and intramuscular hematoma is present but without site of visible active contrast extravasation at this time. Electronically Signed   By: Kreg Shropshire M.D.   On: 11/09/2020 00:44  ____________________________________________   PROCEDURES  Procedure(s) performed (including Critical Care):  Marland Kitchen.Marland Kitchen.Laceration Repair  Date/Time: 11/09/2020 12:35 AM Performed by: Douglas Douglas [name], Douglas Berkland Douglas [name], Douglas Douglas [name] Authorized by: Douglas Douglas [name], Kamal Jurgens Douglas [name], Douglas Douglas [name]   Consent:    Consent obtained:  Verbal and emergent situation   Consent given by:  Patient Laceration details:    Location:  Shoulder/arm  .Critical Care Performed by: Douglas Douglas [name], Fonnie Crookshanks Douglas [name], Douglas Douglas [name] Authorized by: Douglas Douglas [name], Zyaire Dumas Douglas [name], Douglas Douglas [name]   Critical care provider statement:    Critical care time (minutes):  45   Critical care time was exclusive of:  Separately billable procedures and treating other patients   Critical care was necessary to treat or prevent imminent or life-threatening deterioration of the following conditions:  Trauma   Critical care was time spent personally by me on the following activities:  Discussions with consultants, evaluation of patient's response to treatment, examination of patient, ordering and performing treatments and interventions, ordering and review of laboratory studies, ordering and review of radiographic studies, pulse oximetry,  re-evaluation of patient's condition, obtaining history from patient or surrogate and review of old charts     ____________________________________________   INITIAL IMPRESSION / ASSESSMENT AND PLAN / ED COURSE      Patient presents with post history exam for assessment of actively bleeding laceration to his left anterior elbow that occurred immediately prior to arrival after he fell through a window.  No other injuries on history or exam.  Patient is neurovascular intact distally but does have evidence of active arterial bleeding on exam.  On arrival patient's heart rate is 116.  Was blood pressure was initially stable it did decrease while undergoing evaluation emergency room to a systolic of 80.  Hemostasis was achieved using sutures as described above lack repair Douglas [name].  Patient was given 1 unit of emergency release blood given decrease of blood pressure and concern for hemorrhagic shock.  He was also given IV TXA and CTA and x-ray were obtained which showed evidence of hematoma and retained foreign bodies without active bleeding after repair.  Tetanus was updated and patient was given one dose of prophylactic Ancef.    CMP obtained shows no evidence of significant electrolyte or metabolic derangements.  CBC on arrival without evidence of acute anemia or leukocytosis.  INR and fibrinogen are unremarkable.  Covid negative  Discussed with on-call vascular surgeon Dr. Evie LacksEsco who recommended washout and foreign body removal in the OR.  Patient taken to the OR in stable condition.  ____________________________________________   FINAL CLINICAL IMPRESSION(S) / ED DIAGNOSES  Final diagnoses:  Laceration of left upper extremity, initial encounter  Hypotension due to hypovolemia    Medications  0.9 %  sodium chloride infusion (10 mL/hr Intravenous Not Given 11/09/20 0050)  iohexol (OMNIPAQUE) 350 MG/ML injection 100 mL (100 mLs Intravenous Contrast Given 11/09/20 0009)  tranexamic acid  (CYKLOKAPRON) IVPB 1,000 mg (1,000 mg Intravenous Given by EMS 11/08/20 2340)  Tdap (BOOSTRIX) injection 0.5 mL (0.5 mLs Intramuscular Given 11/09/20 0059)  ceFAZolin (ANCEF) 2 g in dextrose 5 % 100 mL IVPB (0 g Intravenous Stopped 11/09/20 0241)     ED Discharge Orders    None       Douglas [name]:  This document was prepared using Dragon voice recognition software and may include unintentional dictation errors.   Douglas Douglas [name], Weslie Pretlow Douglas [name], Douglas Douglas [name] 11/09/20 [phone]

## 2020-11-08 NOTE — ED Triage Notes (Signed)
Pt with large laceration noted to left forearm, elbow with heavy bleeding even with pressure. Pt with cms intact to fingers.

## 2020-11-09 ENCOUNTER — Encounter
Admission: EM | Disposition: A | Discharge status: Home / Self Care | Payer: Self-pay | Source: Home / Self Care | Attending: Emergency Medicine

## 2020-11-09 ENCOUNTER — Emergency Department: Payer: Self-pay

## 2020-11-09 ENCOUNTER — Emergency Department: Payer: Self-pay | Admitting: Certified Registered Nurse Anesthetist

## 2020-11-09 DIAGNOSIS — S41112A Laceration without foreign body of left upper arm, initial encounter: Secondary | ICD-10-CM | POA: Diagnosis present

## 2020-11-09 HISTORY — PX: INSERTION OF DIALYSIS CATHETER: SHX1324

## 2020-11-09 LAB — TYPE AND SCREEN
ABO/RH(D): B POS
Antibody Screen: NEGATIVE
Unit division: 0
Unit division: 0

## 2020-11-09 LAB — COMPREHENSIVE METABOLIC PANEL
ALT: 23 U/L (ref 0–44)
AST: 42 U/L — ABNORMAL HIGH (ref 15–41)
Albumin: 4 g/dL (ref 3.5–5.0)
Alkaline Phosphatase: 84 U/L (ref 38–126)
Anion gap: 12 (ref 5–15)
BUN: 12 mg/dL (ref 6–20)
CO2: 23 mmol/L (ref 22–32)
Calcium: 9.1 mg/dL (ref 8.9–10.3)
Chloride: 102 mmol/L (ref 98–111)
Creatinine, Ser: 0.86 mg/dL (ref 0.61–1.24)
GFR, Estimated: >60 mL/min (ref >60)
Glucose, Bld: 131 mg/dL — ABNORMAL HIGH (ref 70–99)
Potassium: 3.8 mmol/L (ref 3.5–5.1)
Sodium: 137 mmol/L (ref 135–145)
Total Bilirubin: 0.7 mg/dL (ref 0.3–1.2)
Total Protein: 7.7 g/dL (ref 6.5–8.1)

## 2020-11-09 LAB — BPAM RBC
Blood Product Expiration Date: 202201182359
Blood Product Expiration Date: 202201182359
ISSUE DATE / TIME: 202112242339
ISSUE DATE / TIME: 202112242339
Unit Type and Rh: 5100
Unit Type and Rh: 5100

## 2020-11-09 LAB — CBC WITH DIFFERENTIAL/PLATELET
Abs Immature Granulocytes: 0.03 10*3/uL (ref 0.00–0.07)
Basophils Absolute: 0 10*3/uL (ref 0.0–0.1)
Basophils Relative: 0 %
Eosinophils Absolute: 0.1 10*3/uL (ref 0.0–0.5)
Eosinophils Relative: 1 %
HCT: 44.7 % (ref 39.0–52.0)
Hemoglobin: 15.6 g/dL (ref 13.0–17.0)
Immature Granulocytes: 0 %
Lymphocytes Relative: 11 %
Lymphs Abs: 1.1 10*3/uL (ref 0.7–4.0)
MCH: 32.6 pg (ref 26.0–34.0)
MCHC: 34.9 g/dL (ref 30.0–36.0)
MCV: 93.5 fL (ref 80.0–100.0)
Monocytes Absolute: 0.7 10*3/uL (ref 0.1–1.0)
Monocytes Relative: 7 %
Neutro Abs: 8.2 10*3/uL — ABNORMAL HIGH (ref 1.7–7.7)
Neutrophils Relative %: 81 %
Platelets: 261 10*3/uL (ref 150–400)
RBC: 4.78 MIL/uL (ref 4.22–5.81)
RDW: 13 % (ref 11.5–15.5)
WBC: 10.1 10*3/uL (ref 4.0–10.5)
nRBC: 0 % (ref 0.0–0.2)

## 2020-11-09 LAB — CBC
HCT: 41.9 % (ref 39.0–52.0)
Hemoglobin: 14.7 g/dL (ref 13.0–17.0)
MCH: 33.2 pg (ref 26.0–34.0)
MCHC: 35.1 g/dL (ref 30.0–36.0)
MCV: 94.6 fL (ref 80.0–100.0)
Platelets: 233 10*3/uL (ref 150–400)
RBC: 4.43 MIL/uL (ref 4.22–5.81)
RDW: 13.4 % (ref 11.5–15.5)
WBC: 10.4 10*3/uL (ref 4.0–10.5)
nRBC: 0 % (ref 0.0–0.2)

## 2020-11-09 LAB — PROTIME-INR
INR: 0.9 (ref 0.8–1.2)
INR: 1 (ref 0.8–1.2)
Prothrombin Time: 12 seconds (ref 11.4–15.2)
Prothrombin Time: 13 seconds (ref 11.4–15.2)

## 2020-11-09 LAB — RESP PANEL BY RT-PCR (FLU A&B, COVID) ARPGX2
Influenza A by PCR: NEGATIVE
Influenza B by PCR: NEGATIVE
SARS Coronavirus 2 by RT PCR: NEGATIVE

## 2020-11-09 LAB — PREPARE RBC (CROSSMATCH)

## 2020-11-09 LAB — FIBRINOGEN: Fibrinogen: 256 mg/dL (ref 210–475)

## 2020-11-09 SURGERY — INSERTION OF DIALYSIS CATHETER
Anesthesia: General | Site: Arm Upper | Laterality: Left

## 2020-11-09 SURGERY — CANCELLED PROCEDURE
Anesthesia: Choice | Laterality: Left

## 2020-11-09 MED ORDER — ONDANSETRON HCL 4 MG/2ML IJ SOLN
INTRAMUSCULAR | Status: AC
  Filled 2020-11-09: qty 2

## 2020-11-09 MED ORDER — DEXMEDETOMIDINE HCL 200 MCG/2ML IV SOLN
INTRAVENOUS | Status: DC | PRN
  Administered 2020-11-09: 20 ug via INTRAVENOUS

## 2020-11-09 MED ORDER — ONDANSETRON HCL 4 MG/2ML IJ SOLN
INTRAMUSCULAR | Status: DC | PRN
  Administered 2020-11-09: 4 mg via INTRAVENOUS

## 2020-11-09 MED ORDER — SODIUM CHLORIDE 0.9 % IV SOLN
INTRAVENOUS | Status: DC | PRN
  Administered 2020-11-09: 04:00:00 50 ug/min via INTRAVENOUS

## 2020-11-09 MED ORDER — MORPHINE SULFATE (PF) 2 MG/ML IV SOLN: 2.0000 mg | INTRAVENOUS | Status: DC | PRN

## 2020-11-09 MED ORDER — TRANEXAMIC ACID-NACL 1000-0.7 MG/100ML-% IV SOLN: 1000.0000 mg | INTRAVENOUS | Status: AC

## 2020-11-09 MED ORDER — LIDOCAINE HCL 1 % IJ SOLN
INTRAMUSCULAR | Status: DC | PRN
  Administered 2020-11-09: 20 mL

## 2020-11-09 MED ORDER — LIDOCAINE HCL (PF) 2 % IJ SOLN
INTRAMUSCULAR | Status: AC
  Filled 2020-11-09: qty 5

## 2020-11-09 MED ORDER — ACETAMINOPHEN 10 MG/ML IV SOLN
INTRAVENOUS | Status: DC | PRN
  Administered 2020-11-09: 1000 mg via INTRAVENOUS

## 2020-11-09 MED ORDER — CEFAZOLIN SODIUM-DEXTROSE 2-4 GM/100ML-% IV SOLN
2.0000 g | Freq: Once | INTRAVENOUS | Status: DC
  Filled 2020-11-09: qty 100

## 2020-11-09 MED ORDER — ONDANSETRON HCL 4 MG/2ML IJ SOLN: 4.0000 mg | Freq: Once | INTRAMUSCULAR | Status: DC | PRN

## 2020-11-09 MED ORDER — IOHEXOL 350 MG/ML SOLN
100.0000 mL | Freq: Once | INTRAVENOUS | Status: AC | PRN
  Administered 2020-11-09: 100 mL via INTRAVENOUS

## 2020-11-09 MED ORDER — EPHEDRINE 5 MG/ML INJ
INTRAVENOUS | Status: AC
  Filled 2020-11-09: qty 10

## 2020-11-09 MED ORDER — LACTATED RINGERS IV SOLN: INTRAVENOUS | Status: DC | PRN

## 2020-11-09 MED ORDER — ACETAMINOPHEN 10 MG/ML IV SOLN: 1000.0000 mg | Freq: Once | INTRAVENOUS | Status: DC | PRN

## 2020-11-09 MED ORDER — OXYCODONE HCL 5 MG/5ML PO SOLN: 5.0000 mg | Freq: Once | ORAL | Status: DC | PRN

## 2020-11-09 MED ORDER — PROPOFOL 10 MG/ML IV BOLUS
INTRAVENOUS | Status: AC
  Filled 2020-11-09: qty 20

## 2020-11-09 MED ORDER — BACITRACIN ZINC 500 UNIT/GM EX OINT
TOPICAL_OINTMENT | CUTANEOUS | Status: AC
  Filled 2020-11-09: qty 28.35

## 2020-11-09 MED ORDER — MIDAZOLAM HCL 2 MG/2ML IJ SOLN
INTRAMUSCULAR | Status: DC | PRN
  Administered 2020-11-09: 2 mg via INTRAVENOUS

## 2020-11-09 MED ORDER — DEXTROSE 5 % IV SOLN
2.0000 g | Freq: Once | INTRAVENOUS | Status: AC
  Administered 2020-11-09: 02:00:00 2 g via INTRAVENOUS
  Filled 2020-11-09: qty 20

## 2020-11-09 MED ORDER — SUGAMMADEX SODIUM 200 MG/2ML IV SOLN
INTRAVENOUS | Status: DC | PRN
  Administered 2020-11-09: 200 mg via INTRAVENOUS

## 2020-11-09 MED ORDER — EPHEDRINE SULFATE 50 MG/ML IJ SOLN
INTRAMUSCULAR | Status: DC | PRN
  Administered 2020-11-09: 10 mg via INTRAVENOUS
  Administered 2020-11-09 (×4): 5 mg via INTRAVENOUS

## 2020-11-09 MED ORDER — FENTANYL CITRATE (PF) 100 MCG/2ML IJ SOLN
INTRAMUSCULAR | Status: AC
  Filled 2020-11-09: qty 2

## 2020-11-09 MED ORDER — FENTANYL CITRATE (PF) 100 MCG/2ML IJ SOLN
INTRAMUSCULAR | Status: DC | PRN
  Administered 2020-11-09: 50 ug via INTRAVENOUS

## 2020-11-09 MED ORDER — ROCURONIUM BROMIDE 100 MG/10ML IV SOLN
INTRAVENOUS | Status: DC | PRN
  Administered 2020-11-09: 50 mg via INTRAVENOUS

## 2020-11-09 MED ORDER — MIDAZOLAM HCL 2 MG/2ML IJ SOLN
INTRAMUSCULAR | Status: AC
  Filled 2020-11-09: qty 2

## 2020-11-09 MED ORDER — DEXAMETHASONE SODIUM PHOSPHATE 10 MG/ML IJ SOLN
INTRAMUSCULAR | Status: AC
  Filled 2020-11-09: qty 1

## 2020-11-09 MED ORDER — DEXAMETHASONE SODIUM PHOSPHATE 10 MG/ML IJ SOLN
INTRAMUSCULAR | Status: DC | PRN
  Administered 2020-11-09: 10 mg via INTRAVENOUS

## 2020-11-09 MED ORDER — OXYCODONE HCL 5 MG PO TABS: 5.0000 mg | ORAL_TABLET | Freq: Once | ORAL | Status: DC | PRN

## 2020-11-09 MED ORDER — BUPIVACAINE HCL (PF) 0.5 % IJ SOLN
INTRAMUSCULAR | Status: AC
  Filled 2020-11-09: qty 30

## 2020-11-09 MED ORDER — SODIUM CHLORIDE 0.9 % IV SOLN
INTRAVENOUS | Status: DC | PRN
  Administered 2020-11-09: 04:00:00 160 mL

## 2020-11-09 MED ORDER — BACITRACIN 500 UNIT/GM EX OINT
TOPICAL_OINTMENT | CUTANEOUS | Status: DC | PRN
  Administered 2020-11-09: 1 via TOPICAL

## 2020-11-09 MED ORDER — HYDROMORPHONE HCL 1 MG/ML IJ SOLN: 0.5000 mg | INTRAMUSCULAR | Status: DC | PRN

## 2020-11-09 MED ORDER — LIDOCAINE HCL (PF) 1 % IJ SOLN
INTRAMUSCULAR | Status: AC
  Filled 2020-11-09: qty 30

## 2020-11-09 MED ORDER — PHENYLEPHRINE HCL (PRESSORS) 10 MG/ML IV SOLN
INTRAVENOUS | Status: DC | PRN
  Administered 2020-11-09: 100 ug via INTRAVENOUS
  Administered 2020-11-09 (×2): 200 ug via INTRAVENOUS
  Administered 2020-11-09 (×2): 100 ug via INTRAVENOUS

## 2020-11-09 MED ORDER — GENTAMICIN SULFATE 40 MG/ML IJ SOLN
INTRAMUSCULAR | Status: AC
  Filled 2020-11-09: qty 4

## 2020-11-09 MED ORDER — ACETAMINOPHEN 10 MG/ML IV SOLN
INTRAVENOUS | Status: AC
  Filled 2020-11-09: qty 100

## 2020-11-09 MED ORDER — LIDOCAINE HCL (CARDIAC) PF 100 MG/5ML IV SOSY
PREFILLED_SYRINGE | INTRAVENOUS | Status: DC | PRN
  Administered 2020-11-09: 80 mg via INTRAVENOUS

## 2020-11-09 MED ORDER — ROCURONIUM BROMIDE 10 MG/ML (PF) SYRINGE
PREFILLED_SYRINGE | INTRAVENOUS | Status: AC
  Filled 2020-11-09: qty 10

## 2020-11-09 MED ORDER — FENTANYL CITRATE (PF) 100 MCG/2ML IJ SOLN: 25.0000 ug | INTRAMUSCULAR | Status: DC | PRN

## 2020-11-09 MED ORDER — DEXMEDETOMIDINE (PRECEDEX) IN NS 20 MCG/5ML (4 MCG/ML) IV SYRINGE
PREFILLED_SYRINGE | INTRAVENOUS | Status: AC
  Filled 2020-11-09: qty 5

## 2020-11-09 MED ORDER — TETANUS-DIPHTH-ACELL PERTUSSIS 5-2.5-18.5 LF-MCG/0.5 IM SUSY
0.5000 mL | PREFILLED_SYRINGE | Freq: Once | INTRAMUSCULAR | Status: AC
  Administered 2020-11-09: 01:00:00 0.5 mL via INTRAMUSCULAR
  Filled 2020-11-09: qty 0.5

## 2020-11-09 MED ORDER — PROPOFOL 10 MG/ML IV BOLUS
INTRAVENOUS | Status: DC | PRN
  Administered 2020-11-09: 150 mg via INTRAVENOUS

## 2020-11-09 MED ORDER — HYDROCODONE-ACETAMINOPHEN 5-325 MG PO TABS: 1.0000 | ORAL_TABLET | ORAL | Status: DC | PRN

## 2020-11-09 SURGICAL SUPPLY — 36 items
BLADE SURG 15 STRL LF DISP TIS (BLADE) ×2 IMPLANT
BLADE SURG 15 STRL SS (BLADE) ×2
BNDG ELASTIC 4X5.8 VLCR STR LF (GAUZE/BANDAGES/DRESSINGS) ×2 IMPLANT
BNDG GAUZE 4.5X4.1 6PLY STRL (MISCELLANEOUS) ×2 IMPLANT
BOOT SUTURE AID YELLOW STND (SUTURE) ×2 IMPLANT
COVER WAND RF STERILE (DRAPES) ×2 IMPLANT
CUFF TOURN SGL QUICK 18X4 (TOURNIQUET CUFF) ×2 IMPLANT
DRSG TELFA 4X3 1S NADH ST (GAUZE/BANDAGES/DRESSINGS) ×2 IMPLANT
GAUZE SPONGE 4X4 12PLY STRL (GAUZE/BANDAGES/DRESSINGS) ×4 IMPLANT
GAUZE XEROFORM 1X8 LF (GAUZE/BANDAGES/DRESSINGS) ×2 IMPLANT
GLOVE BIO SURGEON STRL SZ7 (GLOVE) ×2 IMPLANT
GLOVE INDICATOR 7.5 STRL GRN (GLOVE) ×2 IMPLANT
GOWN STRL REUS W/ TWL LRG LVL3 (GOWN DISPOSABLE) ×2 IMPLANT
GOWN STRL REUS W/TWL LRG LVL3 (GOWN DISPOSABLE) ×2
KIT TURNOVER KIT A (KITS) ×2 IMPLANT
LABEL OR SOLS (LABEL) ×2 IMPLANT
MANIFOLD NEPTUNE II (INSTRUMENTS) ×2 IMPLANT
NEEDLE HYPO 25X1 1.5 SAFETY (NEEDLE) ×2 IMPLANT
NS IRRIG 1000ML POUR BTL (IV SOLUTION) ×2 IMPLANT
PACK BASIN MINOR ARMC (MISCELLANEOUS) ×2 IMPLANT
PAD ABD DERMACEA PRESS 5X9 (GAUZE/BANDAGES/DRESSINGS) ×2 IMPLANT
SLING ARM LRG DEEP (SOFTGOODS) ×2 IMPLANT
SOL PREP PVP 2OZ (MISCELLANEOUS) ×2
SOLUTION PREP PVP 2OZ (MISCELLANEOUS) ×1 IMPLANT
SPONGE LAP 18X18 RF (DISPOSABLE) ×2 IMPLANT
SUT ETHILON 3-0 FS-10 30 BLK (SUTURE) ×4
SUT SILK 2 0 (SUTURE) ×1
SUT SILK 2-0 18XBRD TIE 12 (SUTURE) ×1 IMPLANT
SUT SILK 3 0 (SUTURE) ×1
SUT SILK 3-0 18XBRD TIE 12 (SUTURE) ×1 IMPLANT
SUT VIC AB 2-0 CT1 (SUTURE) ×2 IMPLANT
SUT VICRYL AB 3-0 FS1 BRD 27IN (SUTURE) ×4 IMPLANT
SUT VICRYL+ 3-0 36IN CT-1 (SUTURE) ×2 IMPLANT
SUTURE EHLN 3-0 FS-10 30 BLK (SUTURE) ×2 IMPLANT
SYR 10ML LL (SYRINGE) ×4 IMPLANT
SYR BULB IRRIG 60ML STRL (SYRINGE) ×2 IMPLANT

## 2020-11-09 NOTE — ED Notes (Signed)
Pt taken to CT, RN accompanied

## 2020-11-09 NOTE — Op Note (Signed)
Date of surgery: 11/09/2020  Preop diagnosis: Complex left upper extremity laceration with hemorrhage Postop diagnosis: Same as above; left antecubital vein laceration Procedure: 1.  Left upper extremity wound exploration and washout                     2.  Repair of left antecubital vein laceration                    3.  Removal of glass fragments in the left upper extremity wound Surgeon: Dr. Evie Lacks Anesthesia: GETA  EBL: 5 mL Complications: None  Findings: Complex left upper extremity/forearm laceration with glass fragments and laceration of the left antecubital vein and small crossing veins.  The brachial artery was intact without evidence of injury radial and ulnar pulses were palpable at the procedure.  The median nerve was not in the field of injury.   Indications for procedure: 29 year old gentleman was hanging Christmas lights there a window when his arm slipped and his hand went through the window he developed complex laceration over the left upper extremity/ forearm with significant blood loss in the field.  Upon arrival to the ER he was found to be hypotensive with continued bleeding.  CT imaging revealed patent brachial radial and ulnar arteries with soft tissue lacerations.  I elected to take him to the operating room for exploration of his vasculature considering there was significant blood loss and complex upper extremity laceration.  Consent was obtained.  Description of procedure: After informed consent was obtained the patient was taken to the operating room laid supine on the operating room table.  Patient received preoperative antibiotics.  General anesthesia was administered.  The left upper extremity was prepped and draped in usual sterile fashion.  A timeout was performed.  A tourniquet was inflated to 250 mmHg, the prior whipstitches placed in the emergency room where removed.  Wound was was copiously irrigated and I removed several pieces of glass from the wound.  The wound  was fairly superficial without injury or exposure to the brachial artery or median nerve. I noted immediately upon further exploration that there was a laceration of the antecubital vein which was primarily repaired with 6-0 Prolene.  I continue to explore the wound and there were small crossing veins that were ligated with 3-0 silk ties.  Tourniquet was taken down and there was no further bleeding hemostasis had been obtained.  The brachial, radial and ulnar artery and the palmar arch had triphasic Doppler signals in all.  The wound was anesthetized with 1% lidocaine.  The wound was then closed using a 3-0 Vicryl and interrupted 3-0 and 4-0 nylon simple and mattress sutures.  It was space left in Telfa wick.  Wound was quite complex requiring multiple sutures for closure.  Bacitracin was placed over the remaining lacerations with glass that was removed from the patient's hand.  The wound was dressed with Xeroform, 4 x 4's, ABD, Kerlix , Ace and arm sling.  Patient tolerated procedure well.  At the conclusion of the procedure the patient had a palpable radial and ulnar  pulse.

## 2020-11-09 NOTE — Anesthesia Preprocedure Evaluation (Signed)
Anesthesia Evaluation  Patient identified by MRN, date of birth, ID band Patient awake    Reviewed: Allergy & Precautions, NPO status , Patient's Chart, lab work & pertinent test results  History of Anesthesia Complications Negative for: history of anesthetic complications  Airway Mallampati: III  TM Distance: >3 FB Neck ROM: Full    Dental no notable dental hx. (+) Teeth Intact   Pulmonary neg pulmonary ROS, neg sleep apnea, neg COPD, Patient abstained from smoking.Not current smoker,    Pulmonary exam normal breath sounds clear to auscultation       Cardiovascular Exercise Tolerance: Good METS(-) hypertension(-) CAD and (-) Past MI negative cardio ROS  (-) dysrhythmias  Rhythm:Regular Rate:Normal - Systolic murmurs    Neuro/Psych negative neurological ROS  negative psych ROS   GI/Hepatic neg GERD  ,(+)     substance abuse  alcohol use,   Endo/Other  neg diabetes  Renal/GU negative Renal ROS     Musculoskeletal   Abdominal   Peds  Hematology   Anesthesia Other Findings History reviewed. No pertinent past medical history.  Reproductive/Obstetrics                             Anesthesia Physical Anesthesia Plan  ASA: II and emergent  Anesthesia Plan: General   Post-op Pain Management:    Induction: Intravenous  PONV Risk Score and Plan: 2 and Ondansetron, Dexamethasone and Midazolam  Airway Management Planned: LMA  Additional Equipment: None  Intra-op Plan:   Post-operative Plan: Extubation in OR  Informed Consent: I have reviewed the patients History and Physical, chart, labs and discussed the procedure including the risks, benefits and alternatives for the proposed anesthesia with the patient or authorized representative who has indicated his/her understanding and acceptance.     Dental advisory given  Plan Discussed with: CRNA and Surgeon  Anesthesia Plan  Comments: (Discussed risks of anesthesia with patient, including PONV, sore throat, lip/dental damage. Rare risks discussed as well, such as cardiorespiratory and neurological sequelae. Patient understands.  Last ate pizza 10 hours ago.)        Anesthesia Quick Evaluation

## 2020-11-09 NOTE — Anesthesia Postprocedure Evaluation (Signed)
Anesthesia Post Note  Patient: Douglas [name]  Procedure(s) Performed: Left Arterial wash out,Laceration repaire   (Left Arm Upper)  Patient location during evaluation: PACU Anesthesia Type: General Level of consciousness: awake and alert Pain management: pain level controlled Vital Signs Assessment: post-procedure vital signs reviewed and stable Respiratory status: spontaneous breathing, nonlabored ventilation, respiratory function stable and patient connected to nasal cannula oxygen Cardiovascular status: blood pressure returned to baseline and stable Postop Assessment: no apparent nausea or vomiting Anesthetic complications: no   No complications documented.   Last Vitals:  Vitals:   11/09/20 0553 11/09/20 0606  BP: 103/65 116/72  Pulse: 70 77  Resp: 12 13  Temp:  (!) 36.2 C  SpO2: 99% 99%    Last Pain:  Vitals:   11/09/20 0606  PainSc: 7                  Corinda Gubler

## 2020-11-09 NOTE — Anesthesia Procedure Notes (Signed)
Procedure Name: Intubation Date/Time: 11/09/2020 3:36 AM Performed by: Hezzie Bump, CRNA Pre-anesthesia Checklist: Patient identified, Patient being monitored, Timeout performed, Emergency Drugs available and Suction available Patient Re-evaluated:Patient Re-evaluated prior to induction Oxygen Delivery Method: Circle system utilized Preoxygenation: Pre-oxygenation with 100% oxygen Induction Type: IV induction Ventilation: Mask ventilation without difficulty Laryngoscope Size: McGraph and 4 Grade View: Grade I Tube type: Oral Tube size: 7.0 mm Number of attempts: 1 Airway Equipment and Method: Stylet Placement Confirmation: ETT inserted through vocal cords under direct vision,  positive ETCO2 and breath sounds checked- equal and bilateral Secured at: 21 cm Tube secured with: Tape Dental Injury: Teeth and Oropharynx as per pre-operative assessment

## 2020-11-09 NOTE — Transfer of Care (Signed)
Immediate Anesthesia Transfer of Care Note  Patient: Douglas [name]  Procedure(s) Performed: Left Arterial wash out,Laceration repaire   (Left Arm Upper)  Patient Location: PACU  Anesthesia Type:General  Level of Consciousness: awake and alert   Airway & Oxygen Therapy: Patient Spontanous Breathing and Patient connected to face mask oxygen  Post-op Assessment: Report given to RN and Post -op Vital signs reviewed and stable  Post vital signs: Reviewed and stable  Last Vitals:  Vitals Value Taken Time  BP 98/58 11/09/20 0526  Temp 36.2 C 11/09/20 0526  Pulse 74 11/09/20 0530  Resp 13 11/09/20 0530  SpO2 100 % 11/09/20 0530  Vitals shown include unvalidated device data.  Last Pain: There were no vitals filed for this visit.       Complications: No complications documented.

## 2020-11-09 NOTE — Progress Notes (Signed)
Patient demanding to discharge as he wishes to go home. RN contacts MD Ria Bush MD and notifies- per MD explains patient would be leaving against medical advice, verbalizes understanding.  MD says he can remove left upper arm wick tomorrow and cover with dry gauze and avoid movement of left upper extremety. Given 4x4 dressings and tape. RN advises against leaving, advises to follow up with primary care, per MD discuss with PCP physical therapy.  Verbalized understanding. AMA form signed and witnessed, put on chart. Refuses wheelchair and walks off unit with girlfriend who states she will drive patient home.
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
## 2020-11-10 ENCOUNTER — Encounter: Payer: Self-pay | Admitting: Vascular Surgery

## 2020-11-10 LAB — HIV ANTIBODY (ROUTINE TESTING W REFLEX): HIV Screen 4th Generation wRfx: NONREACTIVE

## 2020-12-04 ENCOUNTER — Emergency Department
Admission: EM | Admit: 2020-12-04 | Discharge: 2020-12-04 | Disposition: A | Discharge status: Home / Self Care | Payer: Self-pay | Attending: Emergency Medicine | Admitting: Emergency Medicine

## 2020-12-04 ENCOUNTER — Other Ambulatory Visit: Payer: Self-pay

## 2020-12-04 DIAGNOSIS — F17200 Nicotine dependence, unspecified, uncomplicated: Secondary | ICD-10-CM | POA: Insufficient documentation

## 2020-12-04 DIAGNOSIS — Z4802 Encounter for removal of sutures: Secondary | ICD-10-CM | POA: Insufficient documentation

## 2020-12-04 NOTE — ED Provider Notes (Signed)
[facility] Emergency Department Provider Note  ____________________________________________   Event Date/Time   First MD Initiated Contact with Patient 12/04/20 1456     (approximate)  I have reviewed the triage vital signs and the nursing notes.   HISTORY  Chief Complaint Suture / Staple Removal    HPI Douglas [name] is a 30 y.o. male presents emergency department in need of suture removal.  Patient states he did not follow-up with orthopedics as instructed.  States he is not going to follow-up with orthopedics as instructed.  Would like to have his sutures removed.    History reviewed. No pertinent past medical history.  Patient Active Problem List   Diagnosis Date Noted  . Arm laceration with complication, left, initial encounter 11/09/2020    Past Surgical History:  Procedure Laterality Date  . INSERTION OF DIALYSIS CATHETER Left 11/09/2020   Procedure: Left Arterial wash out,Laceration repaire  ;  Surgeon: Bertram Denver, MD;  Location: [facility] ORS;  Service: Cardiovascular;  Laterality: Left;    Prior to Admission medications   Medication Sig Start Date End Date Taking? Authorizing Provider  famotidine (PEPCID) 20 MG tablet Take 1 tablet (20 mg total) by mouth 2 (two) times daily. 09/05/20 09/05/21  Merwyn Katos, MD  sucralfate (CARAFATE) 1 GM/10ML suspension Take 10 mLs (1 g total) by mouth 4 (four) times daily. 09/05/20 09/05/21  Merwyn Katos, MD    Allergies Patient has no known allergies.  No family history on file.  Social History Social History   Tobacco Use  . Smoking status: Current Some Day Smoker  . Smokeless tobacco: Never Used  Substance Use Topics  . Alcohol use: Yes  . Drug use: Not Currently    Review of Systems  Constitutional: No fever/chills Eyes: No visual changes. ENT: No sore throat. Respiratory: Denies cough Genitourinary: Negative for dysuria. Musculoskeletal: Negative for back pain. Skin:  Negative for rash. Psychiatric: no mood changes,     ____________________________________________   PHYSICAL EXAM:  VITAL SIGNS: ED Triage Vitals  Enc Vitals Group     BP 12/04/20 1439 132/89     Pulse Rate 12/04/20 1439 81     Resp 12/04/20 1439 17     Temp 12/04/20 1439 98.1 F (36.7 C)     Temp Source 12/04/20 1439 Oral     SpO2 12/04/20 1439 100 %     Weight 12/04/20 1440 178 lb (80.7 kg)     Height 12/04/20 1440 5\' 11"  (1.803 m)     Head Circumference --      Peak Flow --      Pain Score 12/04/20 1440 0     Pain Loc --      Pain Edu? --      Excl. in GC? --     Constitutional: Alert and oriented. Well appearing and in no acute distress. Eyes: Conjunctivae are normal.  Head: Atraumatic. Nose: No congestion/rhinnorhea. Mouth/Throat: Mucous membranes are moist.  Neck:  supple no lymphadenopathy noted Cardiovascular: Normal rate, regular rhythm.  Respiratory: Normal respiratory effort.  No retractions GU: deferred Musculoskeletal: FROM all extremities, warm and well perfused, sutures are intact on the left forearm, no redness pus or swelling is noted, patient is able to move hand and wrist completely, neurovascular is intact Neurologic:  Normal speech and language.  Skin:  Skin is warm, dry and intact. No rash noted. Psychiatric: Mood and affect are normal. Speech and behavior are normal.  ____________________________________________  LABS (all labs ordered are listed, but only abnormal results are displayed)  Labs Reviewed - No data to display ____________________________________________   ____________________________________________  RADIOLOGY    ____________________________________________   PROCEDURES  Procedure(s) performed: Suture removal performed by nursing staff   Procedures    ____________________________________________   INITIAL IMPRESSION / ASSESSMENT AND PLAN / ED COURSE  Pertinent labs & imaging results that were available  during my care of the patient were reviewed by me and considered in my medical decision making (see chart for details).   The patient is a 30 year old male presents for suture removal.  See HPI.  Physical exam shows a site to appear well-healed.  Sutures removed by nursing staff.  Patient is to follow-up as needed.  Is discharged stable condition.     IMRI LOR was evaluated in Emergency Department on 12/04/2020 for the symptoms described in the history of present illness. He was evaluated in the context of the global COVID-19 pandemic, which necessitated consideration that the patient might be at risk for infection with the SARS-CoV-2 virus that causes COVID-19. Institutional protocols and algorithms that pertain to the evaluation of patients at risk for COVID-19 are in a state of rapid change based on information released by regulatory bodies including the CDC and federal and state organizations. These policies and algorithms were followed during the patient's care in the ED.    As part of my medical decision making, I reviewed the following data within the electronic MEDICAL RECORD NUMBER Nursing notes reviewed and incorporated, Old chart reviewed, Notes from prior ED visits and [place] Controlled Substance Database  ____________________________________________   FINAL CLINICAL IMPRESSION(S) / ED DIAGNOSES  Final diagnoses:  Visit for suture removal      NEW MEDICATIONS STARTED DURING THIS VISIT:  Discharge Medication List as of 12/04/2020  3:00 PM       Note:  This document was prepared using Dragon voice recognition software and may include unintentional dictation errors.    Faythe Ghee, PA-C 12/04/20 1612    Merwyn Katos, MD 12/05/20 [phone]

## 2020-12-04 NOTE — ED Triage Notes (Signed)
Pt is here to have sutures removed from the left FA, states they were put in here 3 weeks ago, no redness noted , healing well.

## 2020-12-04 NOTE — ED Notes (Signed)
Sutures removed from left arm.  Site is clean.  Redness and puffiness around where th esutures were, but no redness beyond.

## 2020-12-04 NOTE — ED Notes (Signed)
Had stitches placed left arm 12/24.  Never came to get them out.  Says they wanted to admit but he did not want to stay.  He has good movement and senasation distally and can touch fingers to thumb.  He says there is anumb feeling on inner wrist and up toward ac area since it happene.

## 2021-02-01 ENCOUNTER — Emergency Department
Admission: EM | Admit: 2021-02-01 | Discharge: 2021-02-01 | Disposition: A | Discharge status: Home / Self Care | Payer: Self-pay | Attending: Emergency Medicine | Admitting: Emergency Medicine

## 2021-02-01 ENCOUNTER — Other Ambulatory Visit: Payer: Self-pay

## 2021-02-01 ENCOUNTER — Emergency Department: Payer: Self-pay

## 2021-02-01 DIAGNOSIS — Y92410 Unspecified street and highway as the place of occurrence of the external cause: Secondary | ICD-10-CM | POA: Insufficient documentation

## 2021-02-01 DIAGNOSIS — F17200 Nicotine dependence, unspecified, uncomplicated: Secondary | ICD-10-CM | POA: Insufficient documentation

## 2021-02-01 DIAGNOSIS — S61412A Laceration without foreign body of left hand, initial encounter: Secondary | ICD-10-CM | POA: Insufficient documentation

## 2021-02-01 DIAGNOSIS — Y9355 Activity, bike riding: Secondary | ICD-10-CM | POA: Insufficient documentation

## 2021-02-01 DIAGNOSIS — Z23 Encounter for immunization: Secondary | ICD-10-CM | POA: Insufficient documentation

## 2021-02-01 DIAGNOSIS — S61402A Unspecified open wound of left hand, initial encounter: Secondary | ICD-10-CM

## 2021-02-01 MED ORDER — BACITRACIN-NEOMYCIN-POLYMYXIN 400-5-5000 EX OINT: 1.0000 "application " | TOPICAL_OINTMENT | Freq: Two times a day (BID) | CUTANEOUS | 1 refills | Status: AC

## 2021-02-01 MED ORDER — TETANUS-DIPHTH-ACELL PERTUSSIS 5-2.5-18.5 LF-MCG/0.5 IM SUSY
0.5000 mL | PREFILLED_SYRINGE | Freq: Once | INTRAMUSCULAR | Status: AC
  Administered 2021-02-01: 0.5 mL via INTRAMUSCULAR
  Filled 2021-02-01: qty 0.5
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
## 2021-02-01 NOTE — Discharge Instructions (Signed)
Keep the left hand wound clean and dry and covered with a clean dressing.    You should change the dressing twice a day by removing the old bandage, gently cleaning the wound with warm soapy water, gently patting dry and allowing to fully air dry, applying antibiotic ointment, and then covering with a clean bandage such as gauze.

## 2021-02-01 NOTE — ED Notes (Signed)
L hand wound dressed with nonadherent gauze.

## 2021-04-06 IMAGING — DX DG HAND COMPLETE 3+V*L*
3 series · 3 of 3 positions shown · non-contrast
Comparison: No pertinent prior exams available for comparison.

CLINICAL DATA: Deep abrasion, question foreign body.

EXAM:
LEFT HAND - COMPLETE 3+ VIEW

[hand ap]
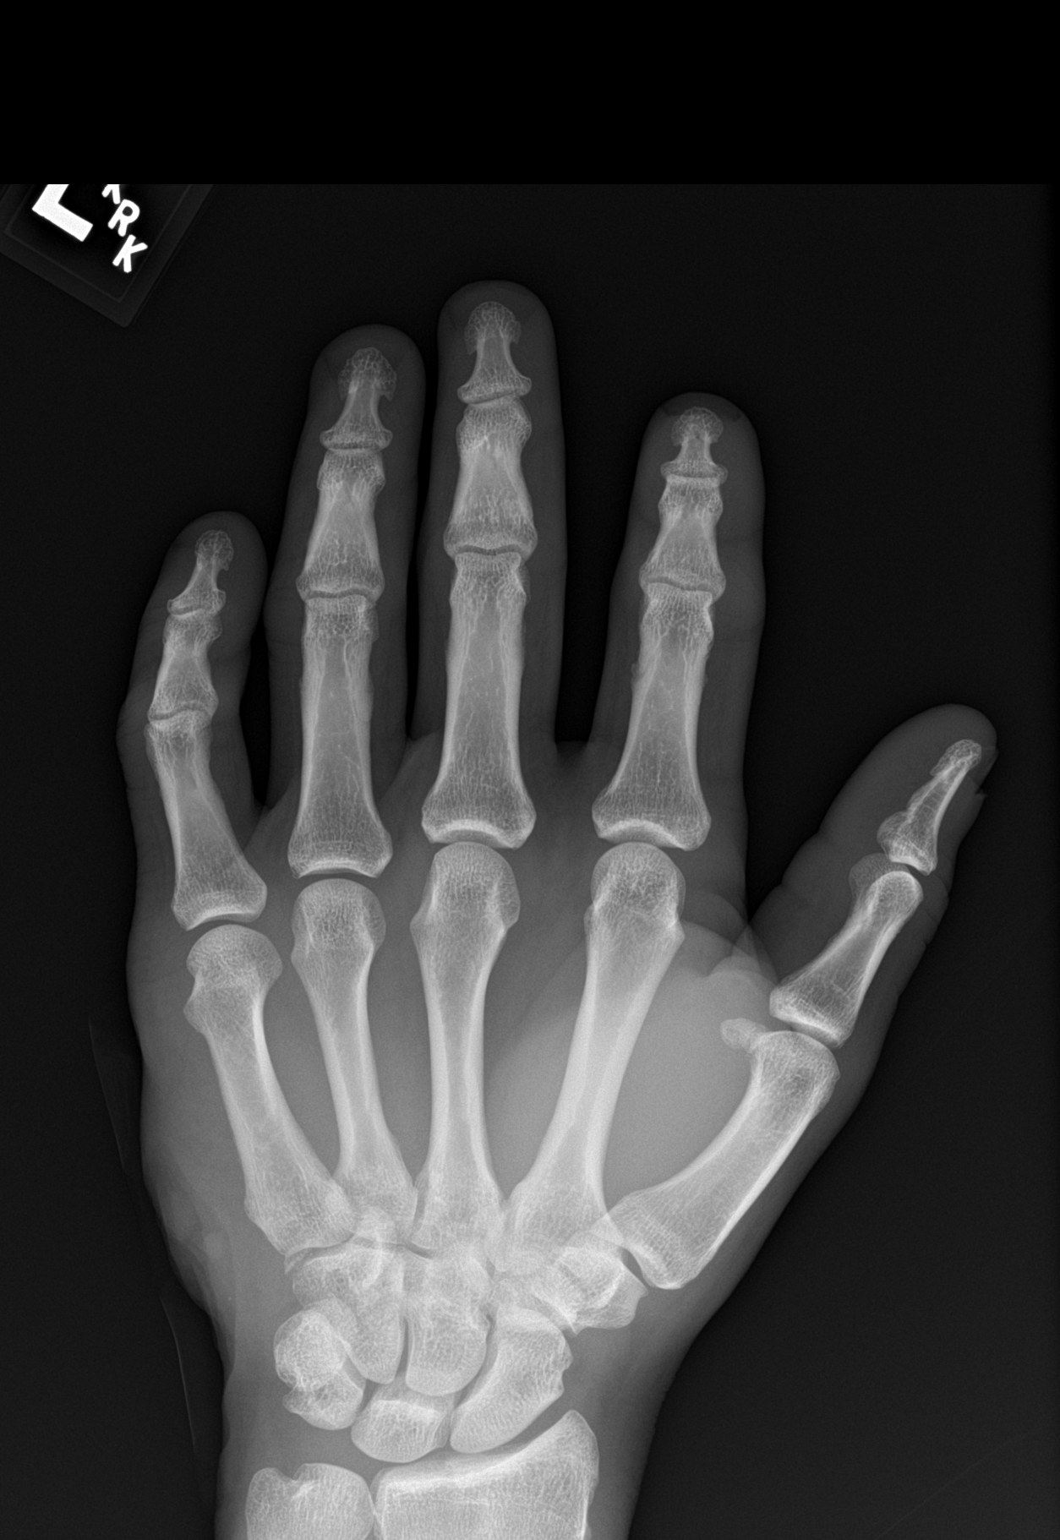

[hand obl]
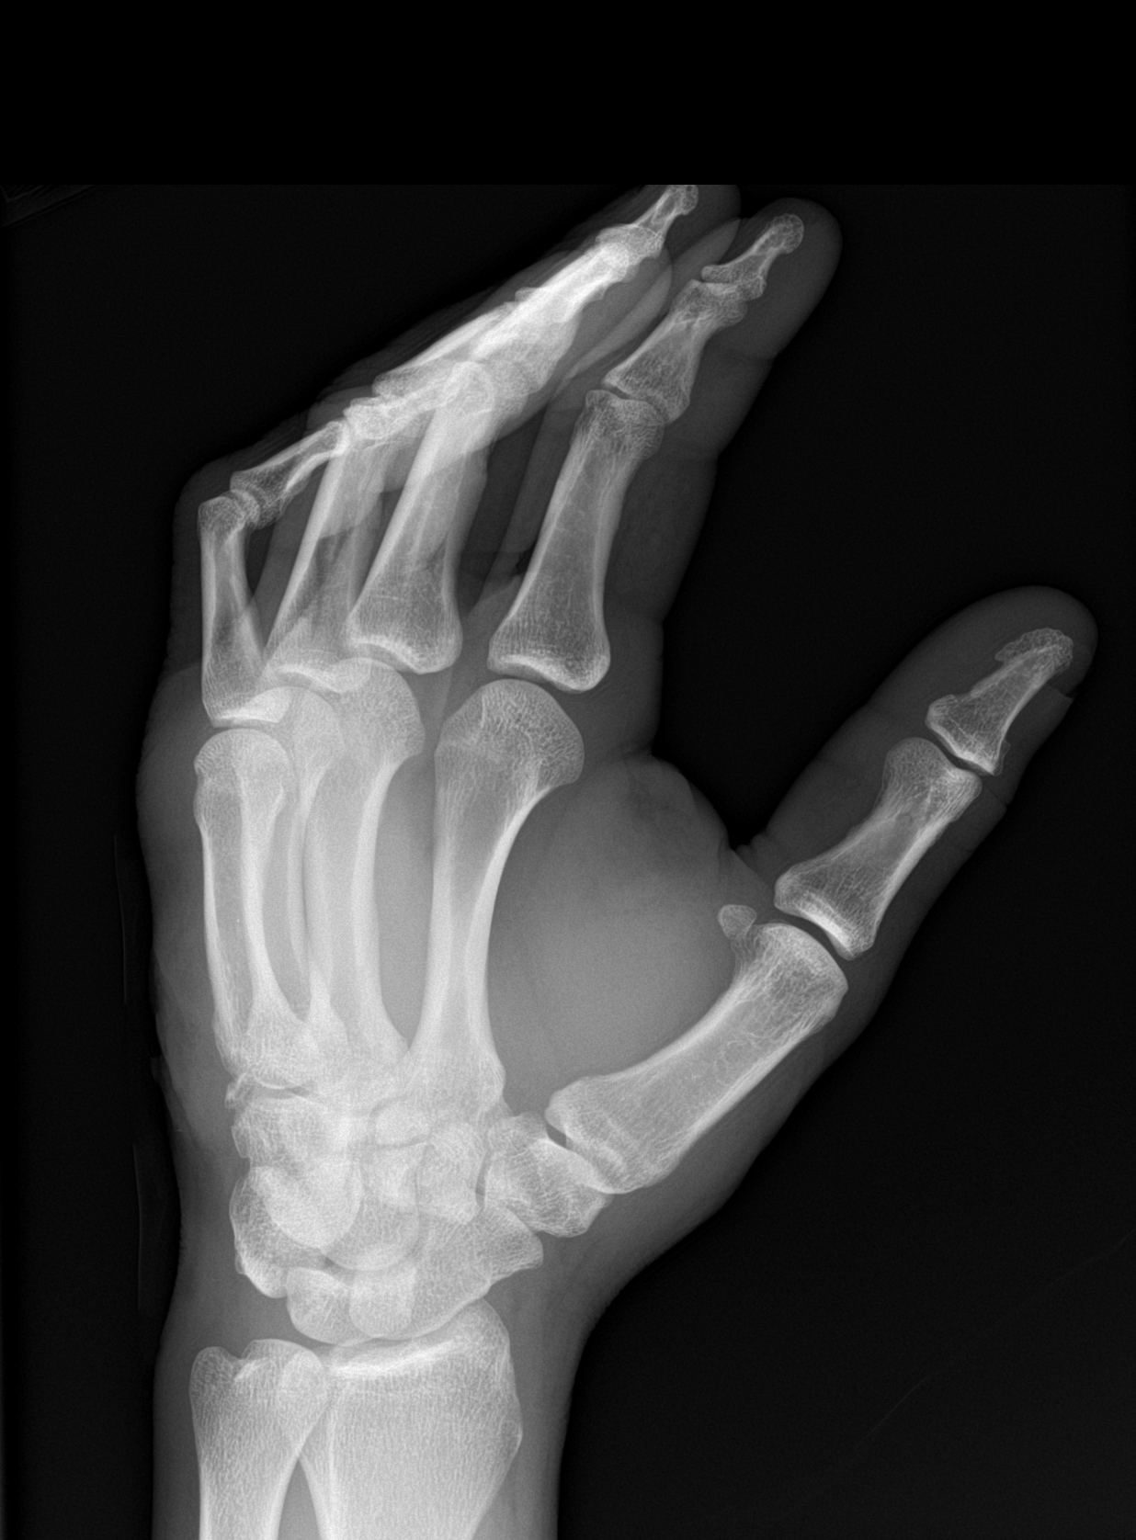

[hand lat]
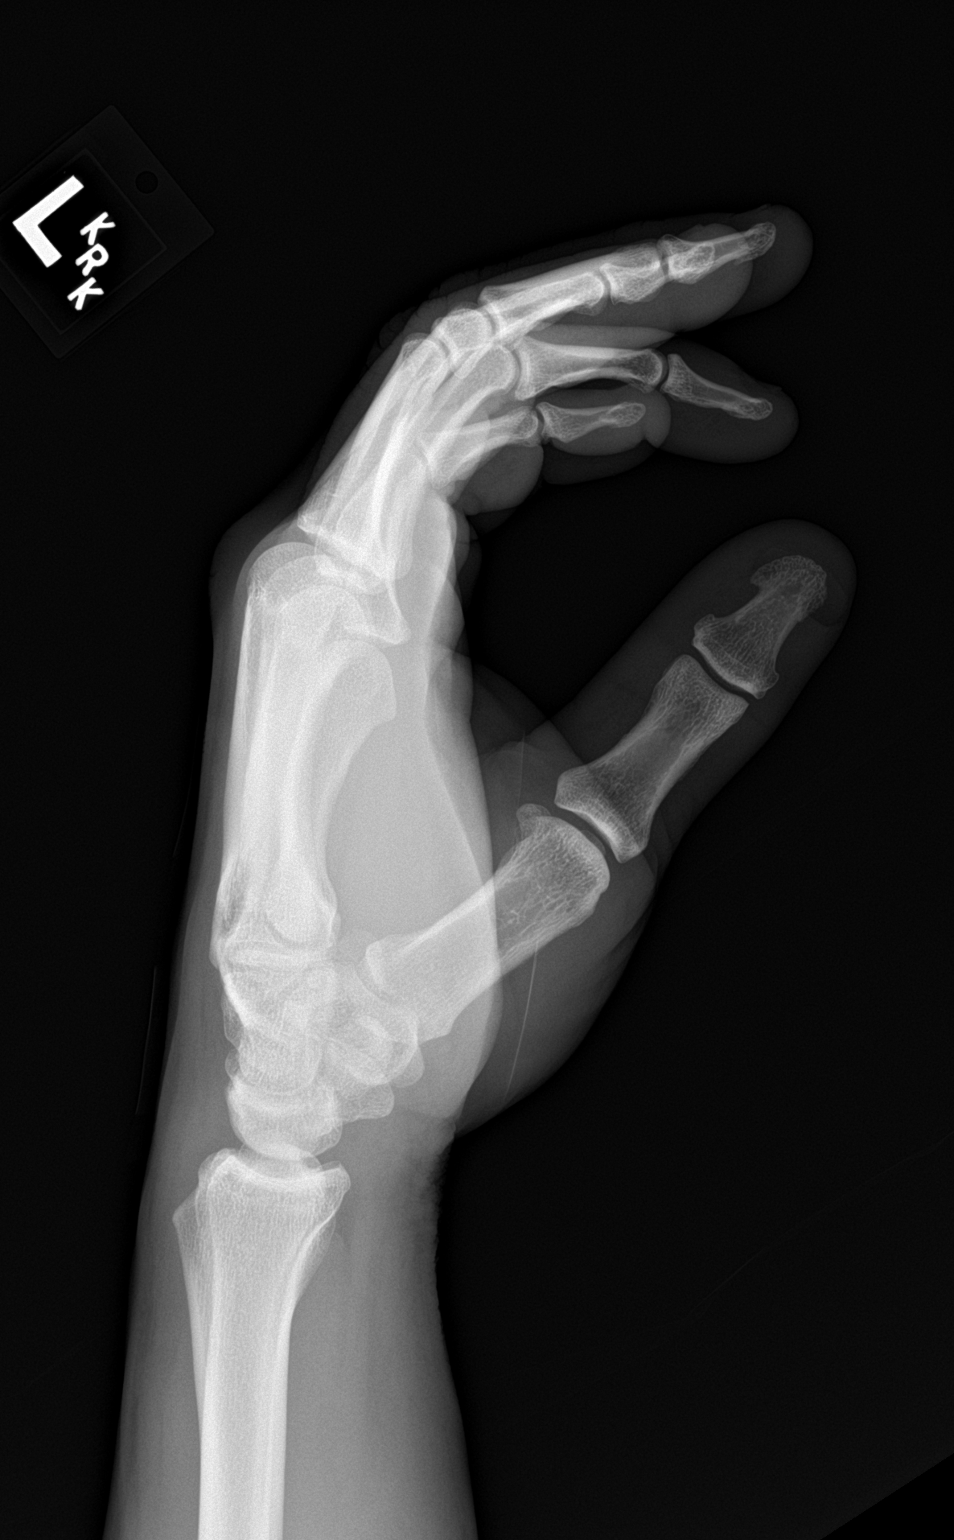

[3 of 3 positions shown; findings below may reference images not displayed]

FINDINGS: Somewhat limited evaluation due to superimposition of the fingers on
the oblique and lateral view radiographs.

There is normal bony alignment.

No evidence of acute osseous or articular abnormality.

The joint spaces are maintained.

Large soft tissue defect within the medial dorsal aspect of the
hand/wrist. Punctate and small rounded opacities superimposed in
this region which may reflect small foreign bodies (see annotations
on image
IMPRESSION: Somewhat limited evaluation for fracture due to superimposition of
the fingers on oblique and lateral view radiographs.

No evidence of acute osseous or articular abnormality.

Large soft tissue defect within the medial dorsal aspect of the
hand/wrist. Punctate and small rounded opacities superimposed in
this region, which may reflect small imbedded foreign bodies.

## 2023-12-09 ENCOUNTER — Emergency Department
Admission: EM | Admit: 2023-12-09 | Discharge: 2023-12-09 | Disposition: A | Discharge status: Home / Self Care | Payer: Self-pay | Attending: Emergency Medicine | Admitting: Emergency Medicine

## 2023-12-09 ENCOUNTER — Emergency Department: Payer: Self-pay

## 2023-12-09 ENCOUNTER — Encounter: Payer: Self-pay | Admitting: Emergency Medicine

## 2023-12-09 ENCOUNTER — Other Ambulatory Visit: Payer: Self-pay

## 2023-12-09 DIAGNOSIS — R1010 Upper abdominal pain, unspecified: Secondary | ICD-10-CM

## 2023-12-09 DIAGNOSIS — K838 Other specified diseases of biliary tract: Secondary | ICD-10-CM | POA: Insufficient documentation

## 2023-12-09 LAB — URINALYSIS, ROUTINE W REFLEX MICROSCOPIC
Bilirubin Urine: NEGATIVE
Glucose, UA: NEGATIVE mg/dL
Hgb urine dipstick: NEGATIVE
Ketones, ur: NEGATIVE mg/dL
Leukocytes,Ua: NEGATIVE
Nitrite: NEGATIVE
Protein, ur: NEGATIVE mg/dL
Specific Gravity, Urine: 1.025 (ref 1.005–1.030)
pH: 5 (ref 5.0–8.0)

## 2023-12-09 LAB — CBC
HCT: 46.2 % (ref 39.0–52.0)
Hemoglobin: 16.2 g/dL (ref 13.0–17.0)
MCH: 31.8 pg (ref 26.0–34.0)
MCHC: 35.1 g/dL (ref 30.0–36.0)
MCV: 90.6 fL (ref 80.0–100.0)
Platelets: 291 10*3/uL (ref 150–400)
RBC: 5.1 MIL/uL (ref 4.22–5.81)
RDW: 11.6 % (ref 11.5–15.5)
WBC: 7.7 10*3/uL (ref 4.0–10.5)
nRBC: 0 % (ref 0.0–0.2)

## 2023-12-09 LAB — COMPREHENSIVE METABOLIC PANEL
ALT: 26 U/L (ref 0–44)
AST: 25 U/L (ref 15–41)
Albumin: 4.6 g/dL (ref 3.5–5.0)
Alkaline Phosphatase: 78 U/L (ref 38–126)
Anion gap: 10 (ref 5–15)
BUN: 18 mg/dL (ref 6–20)
CO2: 26 mmol/L (ref 22–32)
Calcium: 9.5 mg/dL (ref 8.9–10.3)
Chloride: 101 mmol/L (ref 98–111)
Creatinine, Ser: 0.82 mg/dL (ref 0.61–1.24)
GFR, Estimated: >60 mL/min (ref >60)
Glucose, Bld: 109 mg/dL — ABNORMAL HIGH (ref 70–99)
Potassium: 4.1 mmol/L (ref 3.5–5.1)
Sodium: 137 mmol/L (ref 135–145)
Total Bilirubin: 0.9 mg/dL (ref 0.0–1.2)
Total Protein: 8.4 g/dL — ABNORMAL HIGH (ref 6.5–8.1)

## 2023-12-09 LAB — TROPONIN I (HIGH SENSITIVITY)
Troponin I (High Sensitivity): 3 ng/L (ref <18)
Troponin I (High Sensitivity): 3 ng/L (ref <18)

## 2023-12-09 LAB — LIPASE, BLOOD: Lipase: 29 U/L (ref 11–51)

## 2023-12-09 NOTE — ED Provider Notes (Signed)
-----------------------------------------   5:41 PM on 12/09/2023 -----------------------------------------  I took over care of this patient from Dr. Jodie Echevaria.  Repeat troponin is negative.  MRCP is negative.  On reassessment, the patient reports mild pain but is overall feeling better.  MRCP:  IMPRESSION:  Mildly dilated common bile duct measuring 7 mm, as before. No  choledocholithiasis or other obstructing lesion identified.   At this time, the patient is stable for discharge home.  I counseled him on the results of the workup and plan of care.  I gave strict return precautions and he expresses understanding.   Dionne Bucy, MD 12/09/23 [phone]

## 2023-12-09 NOTE — ED Provider Notes (Signed)
[name] Provider Note    Event Date/Time   First MD Initiated Contact with Patient 12/09/23 1405     (approximate)   History   Abdominal Pain   HPI  AVISH CONSIDINE is a 33 y.o. male presenting with intermittent epigastric and right upper quadrant abdominal pain for last 3 days.  He denies any nausea vomiting diarrhea.  No jaundice.  No fever or chills.  States that he is healthy otherwise.  He does not take any medications, no drug use or alcohol use.  No fevers or chills.   On independent review he had a CT of his abdomen pelvis that was done in 2012, states the gallbladder is unremarkable, no focal abnormalities in the liver.   Physical Exam   Triage Vital Signs: ED Triage Vitals  Encounter Vitals Group     BP 12/09/23 1156 130/84     Systolic BP Percentile --      Diastolic BP Percentile --      Pulse Rate 12/09/23 1155 68     Resp 12/09/23 1155 18     Temp 12/09/23 1156 97.9 F (36.6 C)     Temp Source 12/09/23 1156 Oral     SpO2 12/09/23 1155 97 %     Weight 12/09/23 1155 177 lb 14.6 oz (80.7 kg)     Height 12/09/23 1155 5\' 11"  (1.803 m)     Head Circumference --      Peak Flow --      Pain Score 12/09/23 1155 5     Pain Loc --      Pain Education --      Exclude from Growth Chart --     Most recent vital signs: Vitals:   12/09/23 1155 12/09/23 1156  BP:  130/84  Pulse: 68   Resp: 18   Temp:  97.9 F (36.6 C)  SpO2: 97%      General: Awake, no distress.  CV:  Good peripheral perfusion.  Resp:  Normal effort.  Abd:  No distention.  Soft nontender Other:  Moving all 4 extremities, steady gait.   ED Results / Procedures / Treatments   Labs (all labs ordered are listed, but only abnormal results are displayed) Labs Reviewed  COMPREHENSIVE METABOLIC PANEL - Abnormal; Notable for the following components:      Result Value   Glucose, Bld 109 (*)    Total Protein 8.4 (*)    All other components within normal limits   URINALYSIS, ROUTINE W REFLEX MICROSCOPIC - Abnormal; Notable for the following components:   Color, Urine YELLOW (*)    APPearance CLEAR (*)    All other components within normal limits  LIPASE, BLOOD  CBC  TROPONIN I (HIGH SENSITIVITY)  TROPONIN I (HIGH SENSITIVITY)     EKG  Normal sinus rhythm, rate 65, normal QRS, normal QTc, T wave inversion 2 3, T wave flattening to aVF, no ischemic ST elevation, T wave changes new compared to prior   RADIOLOGY Ultrasound images on my interpretation, dilated CBD noted   PROCEDURES:  Critical Care performed: No  Procedures   MEDICATIONS ORDERED IN ED: Medications - No data to display   IMPRESSION / MDM / ASSESSMENT AND PLAN / ED COURSE  I reviewed the triage vital signs and the nursing notes.  Differential diagnosis includes, but is not limited to, considered gastritis, GERD, biliary colic, cholecystitis, pancreatitis, also considered atypical ACS.  Labs and ultrasound was obtained at triage.  Independent review of labs, UA is negative, electrolytes not severely deranged, LFTs are normal, lipase is not elevated, initial troponin is normal, no leukocytosis.  Right upper quadrant ultrasound did show a dilated CBD.  Shared decision making done with patient and he says he has no insurance, unable to get outpatient follow-up, he would like to get the MRCP done here.  No pain at this time.  Patient signed out to oncoming team pending MRCP.  If no obstruction, able to be discharged with outpatient follow-up.  Patient's presentation is most consistent with acute presentation with potential threat to life or bodily function.  Social determinants of health, lack of primary care.      FINAL CLINICAL IMPRESSION(S) / ED DIAGNOSES   Final diagnoses:  Upper abdominal pain  Common bile duct dilation     Rx / DC Orders   ED Discharge Orders          Ordered    Ambulatory Referral to Primary Care (Establish  Care)        12/09/23 1501             Note:  This document was prepared using Dragon voice recognition software and may include unintentional dictation errors.    Claybon Jabs, MD 12/09/23 [phone]

## 2023-12-09 NOTE — ED Notes (Signed)
See triage notes. Patient c/o abdominal pain times three days.

## 2023-12-09 NOTE — ED Triage Notes (Signed)
Pt here with abd pain x3, pt states pain is epigastric in location. Pt denies NVD. Pt states pain is intermittent.
